# Patient Record
Sex: Male | Born: 1955 | Race: White | Hispanic: No | Marital: Married | Smoking: Never smoker
Health system: Southern US, Community
[De-identification: ages and names within clinical notes are randomized; demographics above are authoritative.]

## PROBLEM LIST (undated history)

## (undated) DIAGNOSIS — N529 Male erectile dysfunction, unspecified: Secondary | ICD-10-CM

## (undated) DIAGNOSIS — Z8619 Personal history of other infectious and parasitic diseases: Secondary | ICD-10-CM

## (undated) HISTORY — PX: OTHER SURGICAL HISTORY: SHX169

## (undated) HISTORY — PX: HERNIA REPAIR: SHX51

---

## 2014-02-12 ENCOUNTER — Encounter: Payer: Self-pay | Admitting: Emergency Medicine

## 2014-02-12 ENCOUNTER — Emergency Department (INDEPENDENT_AMBULATORY_CARE_PROVIDER_SITE_OTHER)
Admission: EM | Admit: 2014-02-12 | Discharge: 2014-02-12 | Disposition: A | Discharge status: Home / Self Care | Payer: BC Managed Care – PPO | Source: Home / Self Care | Attending: Family Medicine | Admitting: Family Medicine

## 2014-02-12 DIAGNOSIS — Z8619 Personal history of other infectious and parasitic diseases: Secondary | ICD-10-CM

## 2014-02-12 DIAGNOSIS — F528 Other sexual dysfunction not due to a substance or known physiological condition: Secondary | ICD-10-CM

## 2014-02-12 DIAGNOSIS — J309 Allergic rhinitis, unspecified: Secondary | ICD-10-CM

## 2014-02-12 DIAGNOSIS — F5221 Male erectile disorder: Secondary | ICD-10-CM

## 2014-02-12 HISTORY — DX: Male erectile dysfunction, unspecified: N52.9

## 2014-02-12 HISTORY — DX: Personal history of other infectious and parasitic diseases: Z86.19

## 2014-02-12 MED ORDER — ACYCLOVIR 400 MG PO TABS: ORAL_TABLET | ORAL | Status: DC

## 2014-02-12 MED ORDER — FLUTICASONE PROPIONATE 50 MCG/ACT NA SUSP: NASAL | Status: DC

## 2014-02-12 MED ORDER — SILDENAFIL CITRATE 100 MG PO TABS: 100.0000 mg | ORAL_TABLET | Freq: Every day | ORAL | Status: DC | PRN

## 2014-02-12 MED ORDER — AZITHROMYCIN 250 MG PO TABS: ORAL_TABLET | ORAL | Status: DC

## 2014-02-12 NOTE — ED Notes (Signed)
Reports 3-4 day history of congestion with burning in sinuses; and headache.

## 2014-02-12 NOTE — Discharge Instructions (Signed)
May use Afrin nasal spray (or generic oxymetazoline) once daily for about 5 days (use just prior to Flonase nasal spray).   Also recommend using saline nasal spray several times daily and saline nasal irrigation (AYR is a common brand) Begin Azithromycin if not improving about 1 week or if persistent fever develops

## 2014-02-12 NOTE — ED Provider Notes (Signed)
CSN: [id number]     Arrival date & time 02/12/14  1543 History   First MD Initiated Contact with Patient 02/12/14 1620     Chief Complaint  Patient presents with  . Nasal Congestion  . Headache     HPI Comments: Patient has a history of seasonal allergic rhinitis, and complains of four day history of increased sinus congestion and sneezing without URI symptoms or fever. He does not yet have a local PCP, and requests refill of Viagra for ED, and Acyclovir for recurrent cold sores.  He has had no adverse effects from these meds in the past.  The history is provided by the patient.    Past Medical History  Diagnosis Date  . H/O cold sores   . ED (erectile dysfunction)    Past Surgical History  Procedure Laterality Date  . Hernia repair     Family History  Problem Relation Age of Onset  . Heart failure Father    History  Substance Use Topics  . Smoking status: Never Smoker   . Smokeless tobacco: Not on file  . Alcohol Use: Yes    Review of Systems No sore throat No cough + sneezing No pleuritic pain No wheezing + nasal congestion + post-nasal drainage No sinus pain/pressure No itchy/red eyes No earache No hemoptysis No SOB No fever/chills No nausea No vomiting No abdominal pain No diarrhea No urinary symptoms No skin rash No fatigue No myalgias + headache Used OTC meds without relief  Allergies  Penicillins  Home Medications   Prior to Admission medications   Medication Sig Start Date End Date Taking? Authorizing Provider  acyclovir (ZOVIRAX) 400 MG tablet For recurrent cold sores, take one tab TID for 5 days. 02/12/14   Lattie HawStephen A Ashlynn Gunnels, MD  azithromycin (ZITHROMAX Z-PAK) 250 MG tablet Take 2 tabs today; then begin one tab once daily for 4 more days. (Rx void after 02/20/14) 02/12/14   Lattie HawStephen A Vin Yonke, MD  fluticasone [facility](FLONASE) 50 MCG/ACT nasal spray Place two sprays in each nostril once daily 02/12/14   Lattie HawStephen A Bryanda Mikel, MD  sildenafil (VIAGRA) 100 MG tablet  Take 1 tablet (100 mg total) by mouth daily as needed for erectile dysfunction. 02/12/14   Lattie HawStephen A Kayn Haymore, MD   BP 123/76  Pulse 68  Temp(Src) 98.6 F (37 C) (Oral)  Resp 16  Ht 5\' 11"  (1.803 m)  Wt 179 lb (81.194 kg)  BMI 24.98 kg/m2  SpO2 95% Physical Exam Nursing notes and Vital Signs reviewed. Appearance:  Patient appears healthy, stated age, and in no acute distress Eyes:  Pupils are equal, round, and reactive to light and accomodation.  Extraocular movement is intact.  Conjunctivae are not inflamed  Ears:  Canals normal.  Tympanic membranes normal.  Nose:  Congested turbinates.  No sinus tenderness.    Pharynx:  Normal Neck:  Supple.  Nontender enlarged posterior nodes palpated. Lungs:  Clear to auscultation.  Breath sounds are equal.  Heart:  Regular rate and rhythm without murmurs, rubs, or gallops.  Abdomen:  Nontender without masses or hepatosplenomegaly.  Bowel sounds are present.  No CVA or flank tenderness.  Extremities:  No edema.  No calf tenderness Skin:  No rash present.   ED Course  Procedures  mone      MDM   1. Allergic rhinitis, unspecified allergic rhinitis type   2. Erectile disorder, generalized, mild   3. H/O cold sores    Begin Flonase nasal spray.  May use Afrin nasal  spray (or generic oxymetazoline) once daily for about 5 days (use just prior to Flonase nasal spray).   Also recommend using saline nasal spray several times daily and saline nasal irrigation (AYR is a common brand) Begin Azithromycin if not improving about 1 week or if persistent fever develops (Given a prescription to hold, with an expiration date) Rifill Viagra and Acyclovir.  Followup with PCP for refills.    Lattie Haw, MD 02/18/14 [phone]

## 2014-09-13 ENCOUNTER — Ambulatory Visit (INDEPENDENT_AMBULATORY_CARE_PROVIDER_SITE_OTHER): Payer: BLUE CROSS/BLUE SHIELD | Admitting: Family Medicine

## 2014-09-13 ENCOUNTER — Encounter: Payer: Self-pay | Admitting: Family Medicine

## 2014-09-13 VITALS — BP 129/76 | HR 72 | Ht 71.5 in | Wt 187.0 lb

## 2014-09-13 DIAGNOSIS — N529 Male erectile dysfunction, unspecified: Secondary | ICD-10-CM | POA: Insufficient documentation

## 2014-09-13 DIAGNOSIS — F411 Generalized anxiety disorder: Secondary | ICD-10-CM | POA: Diagnosis not present

## 2014-09-13 MED ORDER — LORAZEPAM 0.5 MG PO TABS: 0.5000 mg | ORAL_TABLET | Freq: Every day | ORAL | Status: DC | PRN

## 2014-09-13 MED ORDER — SILDENAFIL CITRATE 20 MG PO TABS: ORAL_TABLET | ORAL | Status: DC

## 2014-09-13 NOTE — Progress Notes (Signed)
CC: Cameron DarterSteve Washington is a 59 y.o. male is here for Establish Care   Subjective: HPI:  Very pleasant 59 year old here to establish care, neighbor of the [place] family.  Patient reports a history of erectile dysfunction has been present for at least a year. Will happen with all sexual encounters he is monogamous with his wife. He denies any other recent or remote genitourinary complaints. He gets great relief from Viagra however is having to pay $40 per pill which limits how often he can be intimate with his wife. He denies any side effects while taking this medication. He gets benefit from 25-50 milligrams R2 sexual encounters. Denies exertional chest pain does not take nitrates.  Reports a history of situational anxiety that's been present for matter of years that only occurs 2 or 3 times a month. His former provider had provided him with Ativan for these occasions and he tells me that if he gets worked up over family issues with his son who lives in [place] he can take an Ativan and will be able to mentally calm down about the stress of whatever they talked about. He denies any sedation or coordination difficulty or memory loss from the medication. He denies any known side effects. He tells me that the only source of stress in his life is his son. He is happily married And happy with life overall. Denies any depression   Review of Systems - General ROS: negative for - chills, fever, night sweats, weight gain or weight loss Ophthalmic ROS: negative for - decreased vision Psychological ROS: negative for - depression ENT ROS: negative for - hearing change, nasal congestion, tinnitus or allergies Hematological and Lymphatic ROS: negative for - bleeding problems, bruising or swollen lymph nodes Breast ROS: negative Respiratory ROS: no cough, shortness of breath, or wheezing Cardiovascular ROS: no chest pain or dyspnea on exertion Gastrointestinal ROS: no abdominal pain, change in bowel habits, or black or  bloody stools Genito-Urinary ROS: negative for - genital discharge, genital ulcers, incontinence or abnormal bleeding from genitals Musculoskeletal ROS: negative for - joint pain or muscle pain Neurological ROS: negative for - headaches or memory loss Dermatological ROS: negative for lumps, mole changes, rash and skin lesion changes  History reviewed. No pertinent past medical history.  Past Surgical History  Procedure Laterality Date  . Double hernia surgery     Family History  Problem Relation Age of Onset  . Breast cancer Mother   . Heart attack Father   . Diabetes Father   . Hypertension Father     History   Social History  . Marital Status: Married    Spouse Name: N/A  . Number of Children: N/A  . Years of Education: N/A   Occupational History  . Not on file.   Social History Main Topics  . Smoking status: Never Smoker   . Smokeless tobacco: Not on file  . Alcohol Use: No  . Drug Use: No  . Sexual Activity:    Partners: Female   Other Topics Concern  . Not on file   Social History Narrative  . No narrative on file     Objective: BP 129/76 mmHg  Pulse 72  Ht 5' 11.5" (1.816 m)  Wt 187 lb (84.823 kg)  BMI 25.72 kg/m2  General: Alert and Oriented, No Acute Distress HEENT: Pupils equal, round, reactive to light. Conjunctivae clear. Moist mucous membranes  Lungs: Clear to auscultation bilaterally, no wheezing/ronchi/rales.  Comfortable work of breathing. Good air movement. Cardiac: Regular  rate and rhythm. Normal S1/S2.  No murmurs, rubs, nor gallops.   Extremities: No peripheral edema.  Strong peripheral pulses.  Mental Status: No depression, anxiety, nor agitation. Skin: Warm and dry.  Assessment & Plan: Cameron Washington was seen today for establish care.  Diagnoses and all orders for this visit:  Erectile dysfunction, unspecified erectile dysfunction type Orders: -     sildenafil (REVATIO) 20 MG tablet; One to four tabs by mouth as needed for sex.  Anxiety  state Orders: -     LORazepam (ATIVAN) 0.5 MG tablet; Take 1 tablet (0.5 mg total) by mouth daily as needed for anxiety.   Erectile dysfunction: Control, Discussed that he is more than welcome to get a prescription for Viagra however is worth trying a generic form of this that can be purchased at [facility]. I gave him the address of this pharmacy and also a prescription. Anxiety: Controlled on sparing use of lorazepam. Discussed that this is best to be used as infrequently as possible to develop a tolerance.   Return for As needed for complete physical..

## 2014-11-15 ENCOUNTER — Encounter: Payer: Self-pay | Admitting: Family Medicine

## 2014-11-15 ENCOUNTER — Ambulatory Visit (INDEPENDENT_AMBULATORY_CARE_PROVIDER_SITE_OTHER): Payer: BLUE CROSS/BLUE SHIELD | Admitting: Family Medicine

## 2014-11-15 VITALS — BP 131/78 | HR 68 | Wt 180.0 lb

## 2014-11-15 DIAGNOSIS — Z23 Encounter for immunization: Secondary | ICD-10-CM | POA: Diagnosis not present

## 2014-11-15 DIAGNOSIS — N200 Calculus of kidney: Secondary | ICD-10-CM | POA: Diagnosis not present

## 2014-11-15 DIAGNOSIS — L723 Sebaceous cyst: Secondary | ICD-10-CM | POA: Diagnosis not present

## 2014-11-15 DIAGNOSIS — Z0000 Encounter for general adult medical examination without abnormal findings: Secondary | ICD-10-CM

## 2014-11-15 LAB — CBC
HCT: 42.2 % (ref 39.0–52.0)
HEMOGLOBIN: 14.6 g/dL (ref 13.0–17.0)
MCH: 32.4 pg (ref 26.0–34.0)
MCHC: 34.6 g/dL (ref 30.0–36.0)
MCV: 93.8 fL (ref 78.0–100.0)
MPV: 10 fL (ref 8.6–12.4)
Platelets: 298 10*3/uL (ref 150–400)
RBC: 4.5 MIL/uL (ref 4.22–5.81)
RDW: 13.3 % (ref 11.5–15.5)
WBC: 7.8 10*3/uL (ref 4.0–10.5)

## 2014-11-15 LAB — LIPID PANEL
CHOL/HDL RATIO: 3.5 ratio
CHOLESTEROL: 174 mg/dL (ref 0–200)
HDL: 50 mg/dL (ref >=40)
LDL CALC: 108 mg/dL — AB (ref 0–99)
Triglycerides: 82 mg/dL (ref <150)
VLDL: 16 mg/dL (ref 0–40)

## 2014-11-15 LAB — COMPLETE METABOLIC PANEL WITH GFR
ALT: 20 U/L (ref 0–53)
AST: 15 U/L (ref 0–37)
Albumin: 4.1 g/dL (ref 3.5–5.2)
Alkaline Phosphatase: 54 U/L (ref 39–117)
BILIRUBIN TOTAL: 0.5 mg/dL (ref 0.2–1.2)
BUN: 21 mg/dL (ref 6–23)
CO2: 23 mEq/L (ref 19–32)
CREATININE: 1.07 mg/dL (ref 0.50–1.35)
Calcium: 9 mg/dL (ref 8.4–10.5)
Chloride: 104 mEq/L (ref 96–112)
GFR, EST AFRICAN AMERICAN: 87 mL/min
GFR, Est Non African American: 76 mL/min
Glucose, Bld: 106 mg/dL — ABNORMAL HIGH (ref 70–99)
Potassium: 4.4 mEq/L (ref 3.5–5.3)
Sodium: 139 mEq/L (ref 135–145)
Total Protein: 7.4 g/dL (ref 6.0–8.3)

## 2014-11-15 NOTE — Patient Instructions (Signed)
Dr. Chanson Teems's General Advice Following Your Complete Physical Exam  The Benefits of Regular Exercise: Unless you suffer from an uncontrolled cardiovascular condition, studies strongly suggest that regular exercise and physical activity will add to both the quality and length of your life.  The World Health Organization recommends 150 minutes of moderate intensity aerobic activity every week.  This is best split over 3-4 days a week, and can be as simple as a brisk walk for just over 35 minutes "most days of the week".  This type of exercise has been shown to lower LDL-Cholesterol, lower average blood sugars, lower blood pressure, lower cardiovascular disease risk, improve memory, and increase one's overall sense of wellbeing.  The addition of anaerobic (or "strength training") exercises offers additional benefits including but not limited to increased metabolism, prevention of osteoporosis, and improved overall cholesterol levels.  How Can I Strive For A Low-Fat Diet?: Current guidelines recommend that 25-35 percent of your daily energy (food) intake should come from fats.  One might ask how can this be achieved without having to dissect each meal on a daily basis?  Switch to skim or 1% milk instead of whole milk.  Focus on lean meats such as ground turkey, fresh fish, baked chicken, and lean cuts of beef as your source of dietary protein.  Limit saturated fat consumption to less than 10% of your daily caloric intake.  Limit trans fatty acid consumption primarily by limiting synthetic trans fats such as partially hydrogenated oils (Ex: fried fast foods).  Substitute olive or vegetable oil for solid fats where possible.  Moderation of Salt Intake: Provided you don't carry a diagnosis of congestive heart failure nor renal failure, I recommend a daily allowance of no more than 2300 mg of salt (sodium).  Keeping under this daily goal is associated with a decreased risk of cardiovascular events, creeping  above it can lead to elevated blood pressures and increases your risk of cardiovascular events.  Milligrams (mg) of salt is listed on all nutrition labels, and your daily intake can add up faster than you think.  Most canned and frozen dinners can pack in over half your daily salt allowance in one meal.    Lifestyle Health Risks: Certain lifestyle choices carry specific health risks.  As you may already know, tobacco use has been associated with increasing one's risk of cardiovascular disease, pulmonary disease, numerous cancers, among many other issues.  What you may not know is that there are medications and nicotine replacement strategies that can more than double your chances of successfully quitting.  I would be thrilled to help manage your quitting strategy if you currently use tobacco products.  When it comes to alcohol use, I've yet to find an "ideal" daily allowance.  Provided an individual does not have a medical condition that is exacerbated by alcohol consumption, general guidelines determine "safe drinking" as no more than two standard drinks for a man or no more than one standard drink for a male per day.  However, much debate still exists on whether any amount of alcohol consumption is technically "safe".  My general advice, keep alcohol consumption to a minimum for general health promotion.  If you or others believe that alcohol, tobacco, or recreational drug use is interfering with your life, I would be happy to provide confidential counseling regarding treatment options.  General "Over The Counter" Nutrition Advice: Postmenopausal women should aim for a daily calcium intake of 1200 mg, however a significant portion of this might already be   provided by diets including milk, yogurt, cheese, and other dairy products.  Vitamin D has been shown to help preserve bone density, prevent fatigue, and has even been shown to help reduce falls in the elderly.  Ensuring a daily intake of 800 Units of  Vitamin D is a good place to start to enjoy the above benefits, we can easily check your Vitamin D level to see if you'd potentially benefit from supplementation beyond 800 Units a day.  Folic Acid intake should be of particular concern to women of childbearing age.  Daily consumption of 400-800 mcg of Folic Acid is recommended to minimize the chance of spinal cord defects in a fetus should pregnancy occur.    For many adults, accidents still remain one of the most common culprits when it comes to cause of death.  Some of the simplest but most effective preventitive habits you can adopt include regular seatbelt use, proper helmet use, securing firearms, and regularly testing your smoke and carbon monoxide detectors.  Aubert Choyce B. Himani Corona DO [facility] [address], [address] [place], [place] [address] Phone: [phone]  

## 2014-11-15 NOTE — Addendum Note (Signed)
Addended by: Wyline BeadyMCCRIMMON, Burke Terry C on: 11/15/2014 09:15 AM   Modules accepted: Orders

## 2014-11-15 NOTE — Progress Notes (Signed)
CC: Cameron Washington is a 59 y.o. male is here for Annual Exam   Subjective: HPI:  Colonoscopy: Repeat 2020 Prostate: Discussed screening risks/beneifts with patient today, he's agreeable to PSA  Influenza Vaccine: UTD Pneumovax: no current indication Td/Tdap: Will receive Tdap today Zoster: (Start 59 yo)  Please note that patient is already established, he has a second chart with the wrong birthday that was used his first encounter. I've asked the front desk to merge this to his correct birthday which is this chart  No acute complaints  Review of Systems - General ROS: negative for - chills, fever, night sweats, weight gain or weight loss Ophthalmic ROS: negative for - decreased vision Psychological ROS: negative for - anxiety or depression ENT ROS: negative for - hearing change, nasal congestion, tinnitus or allergies Hematological and Lymphatic ROS: negative for - bleeding problems, bruising or swollen lymph nodes Breast ROS: negative Respiratory ROS: no cough, shortness of breath, or wheezing Cardiovascular ROS: no chest pain or dyspnea on exertion Gastrointestinal ROS: no abdominal pain, change in bowel habits, or black or bloody stools Genito-Urinary ROS: negative for - genital discharge, genital ulcers, incontinence or abnormal bleeding from genitals Musculoskeletal ROS: negative for - joint pain or muscle pain Neurological ROS: negative for - headaches or memory loss Dermatological ROS: negative for lumps, mole changes, rash and skin lesion changes  Past Medical History  Diagnosis Date  . H/O cold sores   . ED (erectile dysfunction)     Past Surgical History  Procedure Laterality Date  . Hernia repair     Family History  Problem Relation Age of Onset  . Heart failure Father     History   Social History  . Marital Status: Married    Spouse Name: N/A  . Number of Children: N/A  . Years of Education: N/A   Occupational History  . Not on file.   Social  History Main Topics  . Smoking status: Never Smoker   . Smokeless tobacco: Not on file  . Alcohol Use: Yes  . Drug Use: No  . Sexual Activity: Not on file   Other Topics Concern  . Not on file   Social History Narrative     Objective: BP 131/78 mmHg  Pulse 68  Wt 180 lb (81.647 kg)  General: No Acute Distress HEENT: Atraumatic, normocephalic, conjunctivae normal without scleral icterus.  No nasal discharge, hearing grossly intact, TMs with good landmarks bilaterally with no middle ear abnormalities, posterior pharynx clear without oral lesions. Neck: Supple, trachea midline, no cervical nor supraclavicular adenopathy. Pulmonary: Clear to auscultation bilaterally without wheezing, rhonchi, nor rales. Cardiac: Regular rate and rhythm.  No murmurs, rubs, nor gallops. No peripheral edema.  2+ peripheral pulses bilaterally. Abdomen: Bowel sounds normal.  No masses.  Non-tender without rebound.  Negative Murphy's sign. GU: Bilateral descended testes with no inguinal hernia MSK: Grossly intact, no signs of weakness.  Full strength throughout upper and lower extremities.  Full ROM in upper and lower extremities.  No midline spinal tenderness. Neuro: Gait unremarkable, CN II-XII grossly intact.  C5-C6 Reflex 2/4 Bilaterally, L4 Reflex 2/4 Bilaterally.  Cerebellar function intact. Skin: No rashes. Inflamed sebaceous cyst on the back midline thoracic region Psych: Alert and oriented to person/place/time.  Thought process normal. No anxiety/depression.  Assessment & Plan: Cameron Washington was seen today for annual exam.  Diagnoses and all orders for this visit:  Annual physical exam Orders: -     Lipid panel -  COMPLETE METABOLIC PANEL WITH GFR -     CBC -     PSA  Nephrolithiasis  Sebaceous cyst   Healthy lifestyle interventions including but not limited to regular exercise, a healthy low fat diet, moderation of salt intake, the dangers of tobacco/alcohol/recreational drug use, nutrition  supplementation, and accident avoidance were discussed with the patient and a handout was provided for future reference.  He updates me that he had a episode of nephrolithiasis while visiting [place] a month ago with no residual flank pain.  He's going to visit with a dermatologist this afternoon, I've asked him to let me know if they do not offer to remove the sebaceous cyst and I would be happy to do that in the future.   Return for Annual exams and on an as needed basis.

## 2014-11-16 ENCOUNTER — Encounter: Payer: Self-pay | Admitting: Family Medicine

## 2014-11-16 ENCOUNTER — Telehealth: Payer: Self-pay | Admitting: Family Medicine

## 2014-11-16 DIAGNOSIS — E785 Hyperlipidemia, unspecified: Secondary | ICD-10-CM | POA: Insufficient documentation

## 2014-11-16 DIAGNOSIS — R739 Hyperglycemia, unspecified: Secondary | ICD-10-CM

## 2014-11-16 LAB — PSA: PSA: 1.07 ng/mL (ref <=4.00)

## 2014-11-16 NOTE — Telephone Encounter (Signed)
Wife notified of results ok per release of info. ( pt has duplicate account)

## 2014-11-16 NOTE — Telephone Encounter (Signed)
Sue Lushndrea, Will you please let patient know that his LDL cholesterol was mildly elevated however not to a degree that warrants cholesterol lowering medication. This can be improved with engaging in 30-45 minutes of moderate exercise most days of the week and increasing fiber in the diet.  The psa prostate test was normal as was his  Kidney function, liver function and blood cell counts.  His fasting blood sugar was mildly elevated and I would recommend having a three month average blood sugar checked, can you please ask the lab to add this on and if this isn't possible a lab slip is in your inbox.

## 2014-11-28 LAB — HEMOGLOBIN A1C
HEMOGLOBIN A1C: 5.8 % — AB (ref <5.7)
MEAN PLASMA GLUCOSE: 120 mg/dL — AB (ref <117)

## 2015-06-05 ENCOUNTER — Ambulatory Visit (INDEPENDENT_AMBULATORY_CARE_PROVIDER_SITE_OTHER): Payer: BLUE CROSS/BLUE SHIELD | Admitting: Family Medicine

## 2015-06-05 VITALS — BP 134/95 | HR 70 | Temp 97.9°F | Wt 186.0 lb

## 2015-06-05 DIAGNOSIS — Z23 Encounter for immunization: Secondary | ICD-10-CM | POA: Diagnosis not present

## 2015-06-05 NOTE — Progress Notes (Signed)
Patient came into clinic today for flu vaccination. Pt has a new grandchild due soon and wanted to make sure he was up to date on dtap too. Pt is current on that vaccination. Pt tolerated flu shot well in right deltoid with no complications. Advised to contact our office with any questions/concerns.

## 2015-07-31 ENCOUNTER — Other Ambulatory Visit: Payer: Self-pay | Admitting: Family Medicine

## 2015-11-21 ENCOUNTER — Encounter: Payer: Self-pay | Admitting: Family Medicine

## 2015-11-21 ENCOUNTER — Ambulatory Visit (INDEPENDENT_AMBULATORY_CARE_PROVIDER_SITE_OTHER): Payer: BLUE CROSS/BLUE SHIELD | Admitting: Family Medicine

## 2015-11-21 VITALS — BP 132/70 | HR 63 | Wt 179.0 lb

## 2015-11-21 DIAGNOSIS — Z0000 Encounter for general adult medical examination without abnormal findings: Secondary | ICD-10-CM

## 2015-11-21 LAB — COMPLETE METABOLIC PANEL WITH GFR
ALBUMIN: 4.3 g/dL (ref 3.6–5.1)
ALT: 13 U/L (ref 9–46)
AST: 17 U/L (ref 10–35)
Alkaline Phosphatase: 53 U/L (ref 40–115)
BUN: 25 mg/dL (ref 7–25)
CALCIUM: 9.3 mg/dL (ref 8.6–10.3)
CHLORIDE: 101 mmol/L (ref 98–110)
CO2: 28 mmol/L (ref 20–31)
Creat: 1.18 mg/dL (ref 0.70–1.25)
GFR, EST AFRICAN AMERICAN: 77 mL/min (ref >=60)
GFR, EST NON AFRICAN AMERICAN: 67 mL/min (ref >=60)
Glucose, Bld: 103 mg/dL — ABNORMAL HIGH (ref 65–99)
POTASSIUM: 4.4 mmol/L (ref 3.5–5.3)
Sodium: 137 mmol/L (ref 135–146)
Total Bilirubin: 0.6 mg/dL (ref 0.2–1.2)
Total Protein: 7.1 g/dL (ref 6.1–8.1)

## 2015-11-21 LAB — CBC
HCT: 46.3 % (ref 38.5–50.0)
Hemoglobin: 15.7 g/dL (ref 13.2–17.1)
MCH: 32.6 pg (ref 27.0–33.0)
MCHC: 33.9 g/dL (ref 32.0–36.0)
MCV: 96.1 fL (ref 80.0–100.0)
MPV: 10.4 fL (ref 7.5–12.5)
Platelets: 258 10*3/uL (ref 140–400)
RBC: 4.82 MIL/uL (ref 4.20–5.80)
RDW: 13.3 % (ref 11.0–15.0)
WBC: 5.6 10*3/uL (ref 3.8–10.8)

## 2015-11-21 LAB — LIPID PANEL
CHOL/HDL RATIO: 3.4 ratio (ref <=5.0)
Cholesterol: 191 mg/dL (ref 125–200)
HDL: 57 mg/dL (ref >=40)
LDL Cholesterol: 119 mg/dL (ref <130)
Triglycerides: 74 mg/dL (ref <150)
VLDL: 15 mg/dL (ref <30)

## 2015-11-21 MED ORDER — ACYCLOVIR 400 MG PO TABS: ORAL_TABLET | ORAL | Status: DC

## 2015-11-21 MED ORDER — ZOSTER VACCINE LIVE 19400 UNT/0.65ML ~~LOC~~ SUSR: 0.6500 mL | Freq: Once | SUBCUTANEOUS | Status: DC

## 2015-11-21 NOTE — Patient Instructions (Signed)
Dr. Grey Rakestraw's General Advice Following Your Complete Physical Exam  The Benefits of Regular Exercise: Unless you suffer from an uncontrolled cardiovascular condition, studies strongly suggest that regular exercise and physical activity will add to both the quality and length of your life.  The World Health Organization recommends 150 minutes of moderate intensity aerobic activity every week.  This is best split over 3-4 days a week, and can be as simple as a brisk walk for just over 35 minutes "most days of the week".  This type of exercise has been shown to lower LDL-Cholesterol, lower average blood sugars, lower blood pressure, lower cardiovascular disease risk, improve memory, and increase one's overall sense of wellbeing.  The addition of anaerobic (or "strength training") exercises offers additional benefits including but not limited to increased metabolism, prevention of osteoporosis, and improved overall cholesterol levels.  How Can I Strive For A Low-Fat Diet?: Current guidelines recommend that 25-35 percent of your daily energy (food) intake should come from fats.  One might ask how can this be achieved without having to dissect each meal on a daily basis?  Switch to skim or 1% milk instead of whole milk.  Focus on lean meats such as ground turkey, fresh fish, baked chicken, and lean cuts of beef as your source of dietary protein.  Limit saturated fat consumption to less than 10% of your daily caloric intake.  Limit trans fatty acid consumption primarily by limiting synthetic trans fats such as partially hydrogenated oils (Ex: fried fast foods).  Substitute olive or vegetable oil for solid fats where possible.  Moderation of Salt Intake: Provided you don't carry a diagnosis of congestive heart failure nor renal failure, I recommend a daily allowance of no more than 2300 mg of salt (sodium).  Keeping under this daily goal is associated with a decreased risk of cardiovascular events, creeping  above it can lead to elevated blood pressures and increases your risk of cardiovascular events.  Milligrams (mg) of salt is listed on all nutrition labels, and your daily intake can add up faster than you think.  Most canned and frozen dinners can pack in over half your daily salt allowance in one meal.    Lifestyle Health Risks: Certain lifestyle choices carry specific health risks.  As you may already know, tobacco use has been associated with increasing one's risk of cardiovascular disease, pulmonary disease, numerous cancers, among many other issues.  What you may not know is that there are medications and nicotine replacement strategies that can more than double your chances of successfully quitting.  I would be thrilled to help manage your quitting strategy if you currently use tobacco products.  When it comes to alcohol use, I've yet to find an "ideal" daily allowance.  Provided an individual does not have a medical condition that is exacerbated by alcohol consumption, general guidelines determine "safe drinking" as no more than two standard drinks for a man or no more than one standard drink for a male per day.  However, much debate still exists on whether any amount of alcohol consumption is technically "safe".  My general advice, keep alcohol consumption to a minimum for general health promotion.  If you or others believe that alcohol, tobacco, or recreational drug use is interfering with your life, I would be happy to provide confidential counseling regarding treatment options.  General "Over The Counter" Nutrition Advice: Postmenopausal women should aim for a daily calcium intake of 1200 mg, however a significant portion of this might already be   provided by diets including milk, yogurt, cheese, and other dairy products.  Vitamin D has been shown to help preserve bone density, prevent fatigue, and has even been shown to help reduce falls in the elderly.  Ensuring a daily intake of 800 Units of  Vitamin D is a good place to start to enjoy the above benefits, we can easily check your Vitamin D level to see if you'd potentially benefit from supplementation beyond 800 Units a day.  Folic Acid intake should be of particular concern to women of childbearing age.  Daily consumption of 400-800 mcg of Folic Acid is recommended to minimize the chance of spinal cord defects in a fetus should pregnancy occur.    For many adults, accidents still remain one of the most common culprits when it comes to cause of death.  Some of the simplest but most effective preventitive habits you can adopt include regular seatbelt use, proper helmet use, securing firearms, and regularly testing your smoke and carbon monoxide detectors.  Annie Saephan B. Wendel Homeyer DO [facility] [address], [address] [place], [place] [address] Phone: [phone]  

## 2015-11-21 NOTE — Progress Notes (Signed)
CC: Cameron Washington is a 60 y.o. male is here for Annual Exam   Subjective: HPI:  Colonoscopy: Repeat 2020 Prostate: Discussed screening risks/beneifts with patient today, he is open to the idea of getting a PSA  Influenza Vaccine: No current indication Pneumovax: No current indication Td/Tdap: UTD Zoster: He was given a written prescription for this today and encouraged to see if affordable at his pharmacy, strongly encouraged to get this immunization if affordable.  Requesting complete physical exam with no acute complaints  Review of Systems - General ROS: negative for - chills, fever, night sweats, weight gain or weight loss Ophthalmic ROS: negative for - decreased vision Psychological ROS: negative for - anxiety or depression ENT ROS: negative for - hearing change, nasal congestion, tinnitus or allergies Hematological and Lymphatic ROS: negative for - bleeding problems, bruising or swollen lymph nodes Breast ROS: negative Respiratory ROS: no cough, shortness of breath, or wheezing Cardiovascular ROS: no chest pain or dyspnea on exertion Gastrointestinal ROS: no abdominal pain, change in bowel habits, or black or bloody stools Genito-Urinary ROS: negative for - genital discharge, genital ulcers, incontinence or abnormal bleeding from genitals Musculoskeletal ROS: negative for - joint pain or muscle pain Neurological ROS: negative for - headaches or memory loss Dermatological ROS: negative for lumps, mole changes, rash and skin lesion changes  Past Medical History  Diagnosis Date  . H/O cold sores   . ED (erectile dysfunction)     Past Surgical History  Procedure Laterality Date  . Hernia repair     Family History  Problem Relation Age of Onset  . Heart failure Father     Social History   Social History  . Marital Status: Married    Spouse Name: N/A  . Number of Children: N/A  . Years of Education: N/A   Occupational History  . Not on file.   Social History  Main Topics  . Smoking status: Never Smoker   . Smokeless tobacco: Not on file  . Alcohol Use: Yes  . Drug Use: No  . Sexual Activity: Not on file   Other Topics Concern  . Not on file   Social History Narrative     Objective: BP 132/70 mmHg  Pulse 63  Wt 179 lb (81.194 kg)  General: No Acute Distress HEENT: Atraumatic, normocephalic, conjunctivae normal without scleral icterus.  No nasal discharge, hearing grossly intact, TMs with good landmarks bilaterally with no middle ear abnormalities, posterior pharynx clear without oral lesions. Neck: Supple, trachea midline, no cervical nor supraclavicular adenopathy. Pulmonary: Clear to auscultation bilaterally without wheezing, rhonchi, nor rales. Cardiac: Regular rate and rhythm.  No murmurs, rubs, nor gallops. No peripheral edema.  2+ peripheral pulses bilaterally. Abdomen: Bowel sounds normal.  No masses.  Non-tender without rebound.  Negative Murphy's sign. MSK: Grossly intact, no signs of weakness.  Full strength throughout upper and lower extremities.  Full ROM in upper and lower extremities.  No midline spinal tenderness. Neuro: Gait unremarkable, CN II-XII grossly intact.  C5-C6 Reflex 2/4 Bilaterally, L4 Reflex 2/4 Bilaterally.  Cerebellar function intact. Skin: No rashes. Psych: Alert and oriented to person/place/time.  Thought process normal. No anxiety/depression. Assessment & Plan: Cameron Washington was seen today for annual exam.  Diagnoses and all orders for this visit:  Annual physical exam -     acyclovir (ZOVIRAX) 400 MG tablet; For recurrent cold sores, take one tab TID for 5 days. -     Lipid panel -     CBC -  COMPLETE METABOLIC PANEL WITH GFR -     PSA  Other orders -     Zoster Vaccine Live, PF, (ZOSTAVAX) [id number] UNT/0.65ML injection; Inject 19,400 Units into the skin once.  Healthy lifestyle interventions including but not limited to regular exercise, a healthy low fat diet, moderation of salt intake, the dangers  of tobacco/alcohol/recreational drug use, nutrition supplementation, and accident avoidance were discussed with the patient and a handout was provided for future reference. He would like refills on acyclovir, he takes this sparingly for cold sores. If he gets the shingles vaccine and encouraged him to avoid taking acyclovir for at least a month.  Return in about 1 year (around 11/20/2016) for annual physical.

## 2015-11-22 LAB — PSA: PSA: 0.72 ng/mL (ref <=4.00)

## 2015-12-17 ENCOUNTER — Ambulatory Visit (INDEPENDENT_AMBULATORY_CARE_PROVIDER_SITE_OTHER): Payer: BLUE CROSS/BLUE SHIELD | Admitting: Family Medicine

## 2015-12-17 ENCOUNTER — Encounter: Payer: Self-pay | Admitting: Family Medicine

## 2015-12-17 ENCOUNTER — Ambulatory Visit (INDEPENDENT_AMBULATORY_CARE_PROVIDER_SITE_OTHER): Payer: BLUE CROSS/BLUE SHIELD | Admitting: Sports Medicine

## 2015-12-17 VITALS — BP 128/75 | HR 61 | Wt 182.0 lb

## 2015-12-17 DIAGNOSIS — M25532 Pain in left wrist: Secondary | ICD-10-CM | POA: Diagnosis not present

## 2015-12-17 NOTE — Progress Notes (Signed)
CC: Cameron DarterSteve Washington is a 60 y.o. male is here for Wrist Pain   Subjective: HPI:  Left wrist pain off and on for matter of years now. Over the past month has been bothering him on a daily basis and is now getting in the way of quality of life. He localizes it at the wrist just proximal to the base of the thumb. He denies any radiation but also has some pain on the dorsal aspect of the wrist as well. He's had off and on swelling but never any redness. No benefit from over-the-counter nonsteroidal anti-inflammatories. About once a year he gets an injection in the wrist and wants to know if we can help him with this. Denies any joint pain elsewhere. No fevers no chills or skin changes.   Review Of Systems Outlined In HPI  Past Medical History  Diagnosis Date  . H/O cold sores   . ED (erectile dysfunction)     Past Surgical History  Procedure Laterality Date  . Double hernia surgery    . Hernia repair     Family History  Problem Relation Age of Onset  . Breast cancer Mother   . Heart attack Father   . Diabetes Father   . Hypertension Father   . Heart failure Father     Social History   Social History  . Marital Status: Married    Spouse Name: N/A  . Number of Children: N/A  . Years of Education: N/A   Occupational History  . Not on file.   Social History Main Topics  . Smoking status: Never Smoker   . Smokeless tobacco: Not on file  . Alcohol Use: 0.0 oz/week    0 Standard drinks or equivalent per week  . Drug Use: No  . Sexual Activity:    Partners: Female   Other Topics Concern  . Not on file   Social History Narrative   ** Merged History Encounter **         Objective: BP 128/75 mmHg  Pulse 61  Wt 182 lb (82.555 kg)  Vital signs reviewed. General: Alert and Oriented, No Acute Distress HEENT: Pupils equal, round, reactive to light. Conjunctivae clear.  External ears unremarkable.  Moist mucous membranes. Lungs: Clear and comfortable work of breathing,  speaking in full sentences without accessory muscle use. Cardiac: Regular rate and rhythm.  Neuro: CN II-XII grossly intact, gait normal. Extremities:  Strong peripheral pulses. Mild soft tissue swelling in the dorsal aspect of the left wrist, positive Finkelstein's, no pain anatomic snuffbox, no weakness of the thumb. Mental Status: No depression, anxiety, nor agitation. Logical though process. Skin: Warm and dry.  Assessment & Plan: Brett CanalesSteve was seen today for wrist pain.  Diagnoses and all orders for this visit:  Left wrist pain   Referred him to Dr. Karie Schwalbe in sports medicine for consideration of triamcinolone injection, follow up with me in one week if not beneficial.  Return if symptoms worsen or fail to improve.

## 2015-12-17 NOTE — Progress Notes (Signed)
Subjective:    I'm seeing this patient as a consultation for:  Dr. Laren Boom  CC: Left wrist pain  HPI: This is a pleasant 60 year old male, he comes in with a long history of left wrist pain that responded well to radiocarpal joint injections at an outside office, he desires repeat interventional treatment today, pain is localized over the dorsum of the radiocarpal joint as well as over the first extensor compartment. Moderate, persistent with radiation into the hand and proximally into the forearm. No trauma.  Past medical history, Surgical history, Family history not pertinant except as noted below, Social history, Allergies, and medications have been entered into the medical record, reviewed, and no changes needed.   Review of Systems: No headache, visual changes, nausea, vomiting, diarrhea, constipation, dizziness, abdominal pain, skin rash, fevers, chills, night sweats, weight loss, swollen lymph nodes, body aches, joint swelling, muscle aches, chest pain, shortness of breath, mood changes, visual or auditory hallucinations.   Objective:   General: Well Developed, well nourished, and in no acute distress.  Neuro/Psych: Alert and oriented x3, extra-ocular muscles intact, able to move all 4 extremities, sensation grossly intact. Skin: Warm and dry, no rashes noted.  Respiratory: Not using accessory muscles, speaking in full sentences, trachea midline.  Cardiovascular: Pulses palpable, no extremity edema. Abdomen: Does not appear distended. Left Wrist: Inspection normal with no visible erythema or swelling. ROM smooth and normal with good flexion and extension and ulnar/radial deviation that is symmetrical with opposite wrist. Tender to palpation over the radiocarpal joint, first extensor compartment. No snuffbox tenderness. No tenderness over Canal of [place]. Strength 5/5 in all directions without pain. Positive Finkelstein sign Negative Watson's test.  Procedure: Real-time  Ultrasound Guided Injection of left first extensor compartment Device: GE Logiq E  Verbal informed consent obtained.  Time-out conducted.  Noted no overlying erythema, induration, or other signs of local infection.  Skin prepped in a sterile fashion.  Local anesthesia: Topical Ethyl chloride.  With sterile technique and under real time ultrasound guidance:  1 mL lidocaine, 1 mL Marcaine, 1 mL kenalog 40 injected easily Completed without difficulty  Pain immediately resolved suggesting accurate placement of the medication.  Advised to call if fevers/chills, erythema, induration, drainage, or persistent bleeding.  Images permanently stored and available for review in the ultrasound unit.  Impression: Technically successful ultrasound guided injection.  Procedure: Real-time Ultrasound Guided Injection of left fourth extensor compartment Device: GE Logiq E  Verbal informed consent obtained.  Time-out conducted.  Noted no overlying erythema, induration, or other signs of local infection.  Skin prepped in a sterile fashion.  Local anesthesia: Topical Ethyl chloride.  With sterile technique and under real time ultrasound guidance:  While approaching the radiocarpal joint and noted tendon sheath effusion in the fourth extensor compartment, needle was redirected into the fourth extensor compartment and 1/2 mL lidocaine, 1/2 mL Marcaine, 1/2 mL kenalog 40 injected easily. Completed without difficulty  Pain immediately resolved suggesting accurate placement of the medication.  Advised to call if fevers/chills, erythema, induration, drainage, or persistent bleeding.  Images permanently stored and available for review in the ultrasound unit.  Impression: Technically successful ultrasound guided injection.  Procedure: Real-time Ultrasound Guided Injection of left radiocarpal joint Device: GE Logiq E  Verbal informed consent obtained.  Time-out conducted.  Noted no overlying erythema, induration, or  other signs of local infection.  Skin prepped in a sterile fashion.  Local anesthesia: Topical Ethyl chloride.  With sterile technique and under real time  ultrasound guidance:  After the fourth extensor compartment injection I then redirected the needle into the radiocarpal joint the extensor digitorum communis, I then injected the rest of the medication, which was 1/2 mL lidocaine, 1/2 mL Marcaine, 1/2 mL kenalog 40 Completed without difficulty  Pain immediately resolved suggesting accurate placement of the medication.  Advised to call if fevers/chills, erythema, induration, drainage, or persistent bleeding.  Images permanently stored and available for review in the ultrasound unit.  Impression: Technically successful ultrasound guided injection.  The wrist was then strapped with compressive dressing.  Impression and Recommendations:   This case required medical decision making of moderate complexity.

## 2015-12-17 NOTE — Assessment & Plan Note (Addendum)
Pain is multifactorial including de Quervain's tendinitis, fourth extensor compartment tendinitis, as well as radiocarpal osteoarthritis, injections placed into these 3 structures, return to see me in one month.

## 2016-02-26 ENCOUNTER — Ambulatory Visit (INDEPENDENT_AMBULATORY_CARE_PROVIDER_SITE_OTHER): Payer: BLUE CROSS/BLUE SHIELD | Admitting: Family Medicine

## 2016-02-26 ENCOUNTER — Encounter: Payer: Self-pay | Admitting: Family Medicine

## 2016-02-26 DIAGNOSIS — Z0000 Encounter for general adult medical examination without abnormal findings: Secondary | ICD-10-CM | POA: Diagnosis not present

## 2016-02-26 DIAGNOSIS — M7041 Prepatellar bursitis, right knee: Secondary | ICD-10-CM | POA: Insufficient documentation

## 2016-02-26 DIAGNOSIS — M25532 Pain in left wrist: Secondary | ICD-10-CM

## 2016-02-26 DIAGNOSIS — F411 Generalized anxiety disorder: Secondary | ICD-10-CM | POA: Diagnosis not present

## 2016-02-26 DIAGNOSIS — N529 Male erectile dysfunction, unspecified: Secondary | ICD-10-CM

## 2016-02-26 MED ORDER — SILDENAFIL CITRATE 20 MG PO TABS: ORAL_TABLET | ORAL | 0 refills | Status: DC

## 2016-02-26 MED ORDER — ACYCLOVIR 400 MG PO TABS: ORAL_TABLET | ORAL | 1 refills | Status: DC

## 2016-02-26 MED ORDER — LORAZEPAM 0.5 MG PO TABS: 0.5000 mg | ORAL_TABLET | Freq: Every day | ORAL | 0 refills | Status: DC | PRN

## 2016-02-26 NOTE — Progress Notes (Signed)
   Subjective:    I'm seeing this patient as a consultation for:  Dr. 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  Past medical history, Surgical history, Family history not pertinant except as noted below, Social history, Allergies, and medications have been entered into the medical record, reviewed, and no changes needed.   Review of Systems: No headache, visual changes, nausea, vomiting, diarrhea, constipation, dizziness, abdominal pain, skin rash, fevers, chills, night sweats, weight loss, swollen lymph nodes, body aches, joint swelling, muscle aches, chest pain, shortness of breath, mood changes, visual or auditory hallucinations.   Objective:   General: Well Developed, well nourished, and in no acute distress.  Neuro/Psych: Alert and oriented x3, extra-ocular muscles intact, able to move all 4 extremities, sensation grossly intact. Skin: Warm and dry, no rashes noted.  Respiratory: Not using accessory muscles, speaking in full sentences, trachea midline.  Cardiovascular: Pulses palpable, no extremity edema. Abdomen: Does not appear distended. Right Knee: Normal to inspection with no erythema or effusion or obvious bony abnormalities. Visible and palpable distended prepatellar bursa without overlying erythema or induration ROM normal in flexion and extension and lower leg rotation. Ligaments with solid consistent endpoints including ACL, PCL, LCL, MCL. Negative Mcmurray's and provocative meniscal tests. Non painful patellar compression. Patellar and quadriceps tendons unremarkable. Hamstring and quadriceps strength is normal.  Procedure: Real-time Ultrasound Guided aspiration/Injection of right  prepatellar bursa Device: GE Logiq E  Verbal informed consent obtained.  Time-out conducted.  Noted no overlying erythema, induration, or other signs of local infection.  Skin prepped in a sterile fashion.  Local anesthesia: Topical Ethyl chloride.  With sterile technique and under real time ultrasound guidance:  Aspirated 10 mL serosanguineous fluid, syringe switched and 1 mL Kenalog recall 1 mL lidocaine injected easily. Completed without difficulty  Pain immediately resolved suggesting accurate placement of the medication.  Advised to call if fevers/chills, erythema, induration, drainage, or persistent bleeding.  Images permanently stored and available for review in the ultrasound unit.  Impression: Technically successful ultrasound guided injection.  The knee was then strapped with compressive dressing.  Impression and Recommendations:   This case required medical decision making of moderate complexity.  Prepatellar bursitis of right knee Aspiration with crystal analysis, injection. Return in one month. Strap with compressive dressing.  Left wrist pain Was multifactorial with de Quervain's tendinitis, fourth extensor tendinitis as well as radiocarpal arthritis, never followed up at this resolved completely after injections into each of the 3 structures  several months ago.

## 2016-02-26 NOTE — Assessment & Plan Note (Signed)
Aspiration with crystal analysis, injection. Return in one month. Strap with compressive dressing.

## 2016-02-26 NOTE — Assessment & Plan Note (Signed)
Was multifactorial with de Quervain's tendinitis, fourth extensor tendinitis as well as radiocarpal arthritis, never followed up at this resolved completely after injections into each of the 3 structures  several months ago.

## 2016-02-26 NOTE — Progress Notes (Signed)
CC: Cameron Washington is a 60 y.o. male is here for Joint Swelling   Subjective: HPI:  For the past 3-4 weeks he's had swelling on the right knee. It's anterior to the knee. It's never been painful. 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 He denies any locking catching giving way of the knee. He denies any skin changes elsewhere. No interventions as of yet. Denies any fevers, chills or joint pain.   Review Of Systems Outlined In HPI  Past Medical History:  Diagnosis Date  . ED (erectile dysfunction)   . H/O cold sores     Past Surgical History:  Procedure Laterality Date  . double hernia surgery    . HERNIA REPAIR     Family History  Problem Relation Age of Onset  . Breast cancer Mother   . Heart attack Father   . Diabetes Father   . Hypertension Father   . Heart failure Father     Social History   Social History  . Marital status: Married    Spouse name: N/A  . Number of children: N/A  . Years of education: N/A   Occupational History  . Not on file.   Social History Main Topics  . Smoking status: Never Smoker  . Smokeless tobacco: Not on file  . Alcohol use 0.0 oz/week  . Drug use: No  . Sexual activity: Yes    Partners: Female   Other Topics Concern  . Not on file   Social History Narrative   ** Merged History Encounter **         Objective: BP 112/78   Pulse 66   Wt 182 lb (82.6 kg)   BMI 25.03 kg/m   Vital signs reviewed. General: Alert and Oriented, No Acute Distress HEENT: Pupils equal, round, reactive to light. Conjunctivae clear.  External ears unremarkable.  Moist mucous membranes. Lungs: Clear and comfortable work of breathing, speaking in full sentences without accessory muscle use. Cardiac: Regular rate and rhythm.  Neuro: CN II-XII grossly intact, gait normal. Extremities: No peripheral edema.  Strong peripheral pulses. Just below and anterior to the right knee There is a nontender spongy  mass. Mental Status: No depression, anxiety, nor agitation. Logical though process. Skin: Warm and dry.  Assessment & Plan: Cameron CanalesSteve was seen today for joint swelling.  Diagnoses and all orders for this visit:  Anxiety state -     LORazepam (ATIVAN) 0.5 MG tablet; Take 1 tablet (0.5 mg total) by mouth daily as needed for anxiety.  Annual physical exam -     acyclovir (ZOVIRAX) 400 MG tablet; For recurrent cold sores, take one tab TID for 5 days.  Erectile dysfunction, unspecified erectile dysfunction type -     sildenafil (REVATIO) 20 MG tablet; One to four tabs by mouth as needed for sex.  Prepatellar bursitis of right knee -     Synovial cell count + diff, w/ crystals  Left wrist pain   I consulted Dr. Karie Schwalbe in sports medicine to drain his prepatellar bursitis. He was successfully draining the bursa and the patient was satisfied. Patient would also like refills on the above medications after knowing that I will be leaving in September. Reestablish his with a new PCP he would like to meet the new male physician possibly joining our practice in the coming months.  Return in about 3 months (around 05/28/2016).

## 2016-02-27 LAB — SYNOVIAL CELL COUNT + DIFF, W/ CRYSTALS
Basophils, %: 0 %
Eosinophils-Synovial: 0 % (ref 0–2)
Lymphocytes-Synovial Fld: 24 % (ref 0–74)
Monocyte/Macrophage: 17 % (ref 0–69)
Neutrophil, Synovial: 57 % — ABNORMAL HIGH (ref 0–24)
Synoviocytes, %: 2 % (ref 0–15)
WBC, Synovial: 1275 cells/uL — ABNORMAL HIGH (ref <150)

## 2016-03-27 DIAGNOSIS — B09 Unspecified viral infection characterized by skin and mucous membrane lesions: Secondary | ICD-10-CM | POA: Diagnosis not present

## 2016-04-07 ENCOUNTER — Ambulatory Visit (INDEPENDENT_AMBULATORY_CARE_PROVIDER_SITE_OTHER): Payer: BLUE CROSS/BLUE SHIELD | Admitting: Sports Medicine

## 2016-04-07 DIAGNOSIS — M7041 Prepatellar bursitis, right knee: Secondary | ICD-10-CM | POA: Diagnosis not present

## 2016-04-07 NOTE — Assessment & Plan Note (Signed)
Second aspiration and injection. We will attempt this one more time if needed before sending off for surgical excision of the bursa.

## 2016-04-07 NOTE — Progress Notes (Signed)
  Procedure: Real-time Ultrasound Guided aspiration/injection of right prepatellar bursa Device: GE Logiq E  Verbal informed consent obtained.  Time-out conducted.  Noted no overlying erythema, induration, or other signs of local infection.  Skin prepped in a sterile fashion.  Local anesthesia: Topical Ethyl chloride.  With sterile technique and under real time ultrasound guidance:  Aspirated 9 mL of sanguinous fluid, syringe switched and 1 mL Kenalog  40, 2 mL lidocaine, 2 mL Marcaine injected easily Completed without difficulty  Pain immediately resolved suggesting accurate placement of the medication.  Advised to call if fevers/chills, erythema, induration, drainage, or persistent bleeding.  Images permanently stored and available for review in the ultrasound unit.  Impression: Technically successful ultrasound guided injection.

## 2016-05-25 ENCOUNTER — Ambulatory Visit: Payer: Self-pay | Admitting: Family Medicine

## 2016-05-27 ENCOUNTER — Encounter: Payer: Self-pay | Admitting: *Deleted

## 2016-05-27 ENCOUNTER — Emergency Department
Admission: EM | Admit: 2016-05-27 | Discharge: 2016-05-27 | Disposition: A | Discharge status: Home / Self Care | Payer: BLUE CROSS/BLUE SHIELD | Source: Home / Self Care | Attending: Family Medicine | Admitting: Family Medicine

## 2016-05-27 DIAGNOSIS — L03114 Cellulitis of left upper limb: Secondary | ICD-10-CM

## 2016-05-27 MED ORDER — CEPHALEXIN 500 MG PO CAPS: 500.0000 mg | ORAL_CAPSULE | Freq: Two times a day (BID) | ORAL | 0 refills | Status: DC

## 2016-05-27 NOTE — ED Provider Notes (Signed)
CSN: [id number]     Arrival date & time 05/27/16  0940 History   First MD Initiated Contact with Patient 05/27/16 1015     Chief Complaint  Patient presents with  . Arm Swelling   (Consider location/radiation/quality/duration/timing/severity/associated sxs/prior Treatment) HPI Cameron Washington is a 60 y.o. male presenting to UC with c/o redness and swelling to his Left forearm and Left ring finger that started about 2 nights ago. Pt does not recall any specific insect bites but notes he is outside often.  He also notes a family member's dog jumped on him the other night causing a small scratch near the area of current redness and swelling but notes there is a spot that looks like an insect bite in the middle of the redness.  He has used OTC polysporin w/o relief. He notes he also had to remove his wedding band with soap and water the other night due to swelling to his finger.  Denies fever, chills, n/v/d.    Past Medical History:  Diagnosis Date  . ED (erectile dysfunction)   . H/O cold sores    Past Surgical History:  Procedure Laterality Date  . double hernia surgery    . HERNIA REPAIR     Family History  Problem Relation Age of Onset  . Breast cancer Mother   . Heart attack Father   . Diabetes Father   . Hypertension Father   . Heart failure Father    Social History  Substance Use Topics  . Smoking status: Never Smoker  . Smokeless tobacco: Never Used  . Alcohol use 0.0 oz/week    Review of Systems  Constitutional: Negative for chills and fever.  Gastrointestinal: Negative for diarrhea, nausea and vomiting.  Musculoskeletal: Positive for arthralgias, joint swelling and myalgias.       Left forearm and ring finger  Skin: Positive for color change, rash and wound.  Neurological: Negative for weakness and numbness.    Allergies  Penicillins  Home Medications   Prior to Admission medications   Medication Sig Start Date End Date Taking? Authorizing Provider  LORazepam  (ATIVAN) 0.5 MG tablet Take 1 tablet (0.5 mg total) by mouth daily as needed for anxiety. 02/26/16  Yes Monica Bectonhomas J Thekkekandam, MD  acyclovir (ZOVIRAX) 400 MG tablet For recurrent cold sores, take one tab TID for 5 days. 02/26/16   Monica Bectonhomas J Thekkekandam, MD  cephALEXin (KEFLEX) 500 MG capsule Take 1 capsule (500 mg total) by mouth 2 (two) times daily. For 7 days 05/27/16   Junius FinnerErin O'Malley, PA-C  fluticasone [facility](FLONASE) 50 MCG/ACT nasal spray Place two sprays in each nostril once daily 02/12/14   Lattie HawStephen A Beese, MD  sildenafil (REVATIO) 20 MG tablet One to four tabs by mouth as needed for sex. 02/26/16   Monica Bectonhomas J Thekkekandam, MD   Meds Ordered and Administered this Visit  Medications - No data to display  BP 132/81 (BP Location: Right Arm)   Pulse 73   Temp 98.1 F (36.7 C) (Oral)   Resp 14   Wt 184 lb (83.5 kg)   SpO2 97%   BMI 25.31 kg/m  No data found.   Physical Exam  Constitutional: He is oriented to person, place, and time. He appears well-developed and well-nourished. No distress.  HENT:  Head: Normocephalic and atraumatic.  Eyes: EOM are normal.  Neck: Normal range of motion.  Cardiovascular: Normal rate.   Pulmonary/Chest: Effort normal.  Musculoskeletal: Normal range of motion. He exhibits edema. He exhibits no tenderness.  Left forearm: mild edema to lateral aspect. Non-tender. Full ROM elbow, wrist and fingers. Left ring finger: mild edema, full ROM, non-tender.  Neurological: He is alert and oriented to person, place, and time.  Skin: Skin is warm and dry. Capillary refill takes less than 2 seconds. Rash noted. He is not diaphoretic. There is erythema.  Left forearm: 2-3cm area of erythema, warmth and centralized superficial abrasion c/w insect bite. Mild induration. No fluctuance. Non-tender. Left ring finger: small erythematous pustule to middle phalanx. Scant clear discharge. No red streaking.   Psychiatric: He has a normal mood and affect. His behavior is normal.  Nursing  note and vitals reviewed.   Urgent Care Course   Clinical Course     Procedures (including critical care time)  Labs Review Labs Reviewed - No data to display  Imaging Review No results found.    MDM   1. Cellulitis of left arm    Pt presenting to UC with cellulitis of Left forearm and small pustule on Left ring finger w/o systemic symptoms.   No indication for I&D as no abscess present at this time. Rx: Keflex (only has rash with PCN) discussed signs/symptoms that warrant change of antibiotic.  Encouraged f/u in 4-5 days if not improving, sooner if worsening. Patient verbalized understanding and agreement with treatment plan.    Junius Finnerrin O'Malley, PA-C 05/27/16 1036

## 2016-05-27 NOTE — ED Triage Notes (Signed)
Pt c/o awakening 2 night ago with left 4th finger itching with a possible bite site. Next day LFA swelling with 2 possible bite sites. He feels well otherwise.

## 2016-05-27 NOTE — Discharge Instructions (Signed)
°  Please keep hand and arm clean with soap and water.  You may continue to use over the counter polysporin.    You may use a warm compress such as a warm damp washcloth on forearm to help with swelling.  Also, you may try to keep your hand and arm elevated to help reduce swelling.

## 2016-05-28 DIAGNOSIS — Z880 Allergy status to penicillin: Secondary | ICD-10-CM | POA: Diagnosis not present

## 2016-05-28 DIAGNOSIS — W0110XA Fall on same level from slipping, tripping and stumbling with subsequent striking against unspecified object, initial encounter: Secondary | ICD-10-CM | POA: Diagnosis not present

## 2016-05-28 DIAGNOSIS — S4991XA Unspecified injury of right shoulder and upper arm, initial encounter: Secondary | ICD-10-CM | POA: Diagnosis not present

## 2016-05-28 DIAGNOSIS — Z79899 Other long term (current) drug therapy: Secondary | ICD-10-CM | POA: Diagnosis not present

## 2016-05-28 DIAGNOSIS — M25511 Pain in right shoulder: Secondary | ICD-10-CM | POA: Diagnosis not present

## 2016-06-01 DIAGNOSIS — M25511 Pain in right shoulder: Secondary | ICD-10-CM | POA: Diagnosis not present

## 2016-06-05 DIAGNOSIS — M25511 Pain in right shoulder: Secondary | ICD-10-CM | POA: Diagnosis not present

## 2016-06-08 DIAGNOSIS — M25511 Pain in right shoulder: Secondary | ICD-10-CM | POA: Diagnosis not present

## 2016-10-12 ENCOUNTER — Ambulatory Visit (INDEPENDENT_AMBULATORY_CARE_PROVIDER_SITE_OTHER): Payer: BLUE CROSS/BLUE SHIELD

## 2016-10-12 ENCOUNTER — Encounter: Payer: Self-pay | Admitting: Sports Medicine

## 2016-10-12 ENCOUNTER — Ambulatory Visit (INDEPENDENT_AMBULATORY_CARE_PROVIDER_SITE_OTHER): Payer: BLUE CROSS/BLUE SHIELD | Admitting: Sports Medicine

## 2016-10-12 DIAGNOSIS — M25532 Pain in left wrist: Secondary | ICD-10-CM

## 2016-10-12 NOTE — Progress Notes (Signed)
  Subjective:    CC: Left hand pain  HPI: This is a pleasant 61 year old male, about 8 months ago we did injections into the first extensor compartment, fourth extensor compartment, and radiocarpal joint. This provided fantastic relief, over the past couple weeks he's had a recurrence of pain over the first extensor pulmonary the radiocarpal joint and is requesting interventional treatment today, pain is moderate, persistent without radiation, no mechanical symptoms, no trauma.  Past medical history:  Negative.  See flowsheet/record as well for more information.  Surgical history: Negative.  See flowsheet/record as well for more information.  Family history: Negative.  See flowsheet/record as well for more information.  Social history: Negative.  See flowsheet/record as well for more information.  Allergies, and medications have been entered into the medical record, reviewed, and no changes needed.   Review of Systems: No fevers, chills, night sweats, weight loss, chest pain, or shortness of breath.   Objective:    General: Well Developed, well nourished, and in no acute distress.  Neuro: Alert and oriented x3, extra-ocular muscles intact, sensation grossly intact.  HEENT: Normocephalic, atraumatic, pupils equal round reactive to light, neck supple, no masses, no lymphadenopathy, thyroid nonpalpable.  Skin: Warm and dry, no rashes. Cardiac: Regular rate and rhythm, no murmurs rubs or gallops, no lower extremity edema.  Respiratory: Clear to auscultation bilaterally. Not using accessory muscles, speaking in full sentences.  Procedure: Real-time Ultrasound Guided Injection of left first extensor compartment Device: GE Logiq E  Verbal informed consent obtained.  Time-out conducted.  Noted no overlying erythema, induration, or other signs of local infection.  Skin prepped in a sterile fashion.  Local anesthesia: Topical Ethyl chloride.  With sterile technique and under real time ultrasound  guidance:  Dorsal branch of the radial artery noted just deep to the first extensor compartment, 25-gauge needle advanced into the extensor compartment taking care to stay superficial, 1 mL Kenalog 40, 1 mL lidocaine injected easily. Completed without difficulty  Pain immediately resolved suggesting accurate placement of the medication.  Advised to call if fevers/chills, erythema, induration, drainage, or persistent bleeding.  Images permanently stored and available for review in the ultrasound unit.  Impression: Technically successful ultrasound guided injection.  Procedure: Real-time Ultrasound Guided Injection of left radiocarpal joint Device: GE Logiq E  Verbal informed consent obtained.  Time-out conducted.  Noted no overlying erythema, induration, or other signs of local infection.  Skin prepped in a sterile fashion.  Local anesthesia: Topical Ethyl chloride.  With sterile technique and under real time ultrasound guidance:  25-gauge needle advanced into the radiocarpal joint and 1 mL Kenalog 40, 1 mL lidocaine injected easily. Completed without difficulty  Pain immediately resolved suggesting accurate placement of the medication.  Advised to call if fevers/chills, erythema, induration, drainage, or persistent bleeding.  Images permanently stored and available for review in the ultrasound unit.  Impression: Technically successful ultrasound guided injection.  Impression and Recommendations:    Left wrist pain Pain generators include first extensor compartment and radiocarpal joint. Last injection was 8 months ago. Repeat injections as above. Return in 1 month. We do need baseline x-rays.

## 2016-10-12 NOTE — Assessment & Plan Note (Signed)
Pain generators include first extensor compartment and radiocarpal joint. Last injection was 8 months ago. Repeat injections as above. Return in 1 month. We do need baseline x-rays.

## 2016-11-09 ENCOUNTER — Ambulatory Visit: Payer: BLUE CROSS/BLUE SHIELD | Admitting: Sports Medicine

## 2016-11-16 ENCOUNTER — Ambulatory Visit: Payer: BLUE CROSS/BLUE SHIELD | Admitting: Sports Medicine

## 2017-04-21 ENCOUNTER — Encounter: Payer: Self-pay | Admitting: Sports Medicine

## 2017-04-21 ENCOUNTER — Ambulatory Visit (INDEPENDENT_AMBULATORY_CARE_PROVIDER_SITE_OTHER): Payer: BLUE CROSS/BLUE SHIELD | Admitting: Sports Medicine

## 2017-04-21 DIAGNOSIS — M25532 Pain in left wrist: Secondary | ICD-10-CM | POA: Diagnosis not present

## 2017-04-21 NOTE — Progress Notes (Signed)
Subjective:    CC: Left wrist pain  HPI: This is a pleasant 61 year old male, I saw him 7 months ago, he was having pain referable to the first extensor compartment as well as the radiocarpal joint, the structures were injected with good relief, he is having a similar recurrence of pain and desires injections today, pain is localized over the to above-named structures, no radiation. No mechanical symptoms, no trauma.  Past medical history:  Negative.  See flowsheet/record as well for more information.  Surgical history: Negative.  See flowsheet/record as well for more information.  Family history: Negative.  See flowsheet/record as well for more information.  Social history: Negative.  See flowsheet/record as well for more information.  Allergies, and medications have been entered into the medical record, reviewed, and no changes needed.   Review of Systems: No fevers, chills, night sweats, weight loss, chest pain, or shortness of breath.   Objective:    General: Well Developed, well nourished, and in no acute distress.  Neuro: Alert and oriented x3, extra-ocular muscles intact, sensation grossly intact.  HEENT: Normocephalic, atraumatic, pupils equal round reactive to light, neck supple, no masses, no lymphadenopathy, thyroid nonpalpable.  Skin: Warm and dry, no rashes. Cardiac: Regular rate and rhythm, no murmurs rubs or gallops, no lower extremity edema.  Respiratory: Clear to auscultation bilaterally. Not using accessory muscles, speaking in full sentences. Left Wrist: Inspection normal with no visible erythema or swelling. ROM smooth and normal with good flexion and extension and ulnar/radial deviation that is symmetrical with opposite wrist. Palpation is normal over metacarpals, navicular, lunate, and TFCC; tendons without tenderness/ swelling No snuffbox tenderness. Tenderness over the dorsal radiocarpal joint. No tenderness over Canal of Guyon. Strength 5/5 in all directions  without pain. Negative tinel's and phalens signs. Minimally positive Finkelstein sign with tenderness over the first extensor compartment Negative Watson's test.  Procedure: Real-time Ultrasound Guided Injection of left first extensor compartment Device: GE Logiq E  Verbal informed consent obtained.  Time-out conducted.  Noted no overlying erythema, induration, or other signs of local infection.  Skin prepped in a sterile fashion.  Local anesthesia: Topical Ethyl chloride.  With sterile technique and under real time ultrasound guidance:  Dorsal branch of the radial artery noted just deep to the first extensor compartment, 25-gauge needle advanced into the extensor compartment taking care to stay superficial, 0.5 mL Kenalog 40, 0.5 mL lidocaine injected easily. Completed without difficulty  Pain immediately resolved suggesting accurate placement of the medication.  Advised to call if fevers/chills, erythema, induration, drainage, or persistent bleeding.  Images permanently stored and available for review in the ultrasound unit.  Impression: Technically successful ultrasound guided injection.  Procedure: Real-time Ultrasound Guided Injection of left radiocarpal joint Device: GE Logiq E  Verbal informed consent obtained.  Time-out conducted.  Noted no overlying erythema, induration, or other signs of local infection.  Skin prepped in a sterile fashion.  Local anesthesia: Topical Ethyl chloride.  With sterile technique and under real time ultrasound guidance:  25-gauge needle advanced into the radiocarpal joint and 1 mL Kenalog 40, 1 mL lidocaine injected easily. Completed without difficulty  Pain immediately resolved suggesting accurate placement of the medication.  Advised to call if fevers/chills, erythema, induration, drainage, or persistent bleeding.  Images permanently stored and available for review in the ultrasound unit.  Impression: Technically successful ultrasound guided  injection.  Impression and Recommendations:    Left wrist pain 7 month response to previous injections, repeat left first extensor compartment and  radiocarpal joint injections as above. Wrist was strapped with compressive dressing, return as needed.  ___________________________________________ Ihor Austin. Benjamin Stain, M.D., ABFM., CAQSM. Primary Care and Sports Medicine [place] [facility]  Adjunct Instructor of Family Medicine  [employer]

## 2017-04-21 NOTE — Assessment & Plan Note (Signed)
7 month response to previous injections, repeat left first extensor compartment and radiocarpal joint injections as above. Wrist was strapped with compressive dressing, return as needed.

## 2017-05-21 ENCOUNTER — Ambulatory Visit (INDEPENDENT_AMBULATORY_CARE_PROVIDER_SITE_OTHER): Payer: BLUE CROSS/BLUE SHIELD | Admitting: Sports Medicine

## 2017-05-21 ENCOUNTER — Ambulatory Visit (INDEPENDENT_AMBULATORY_CARE_PROVIDER_SITE_OTHER): Payer: BLUE CROSS/BLUE SHIELD

## 2017-05-21 DIAGNOSIS — M11261 Other chondrocalcinosis, right knee: Secondary | ICD-10-CM

## 2017-05-21 DIAGNOSIS — M1711 Unilateral primary osteoarthritis, right knee: Secondary | ICD-10-CM

## 2017-05-21 DIAGNOSIS — M7989 Other specified soft tissue disorders: Secondary | ICD-10-CM | POA: Diagnosis not present

## 2017-05-21 DIAGNOSIS — M11262 Other chondrocalcinosis, left knee: Secondary | ICD-10-CM

## 2017-05-21 DIAGNOSIS — M25562 Pain in left knee: Secondary | ICD-10-CM | POA: Diagnosis not present

## 2017-05-21 MED ORDER — IBUPROFEN 800 MG PO TABS: 800.0000 mg | ORAL_TABLET | Freq: Three times a day (TID) | ORAL | 2 refills | Status: DC | PRN

## 2017-05-21 NOTE — Progress Notes (Signed)
  Subjective:    CC: Right knee pain  HPI: For the past several weeks this pleasant 61 year old male has noted increasing pain, without swelling in his right knee, medial joint line, no mechanical symptoms, moderate, persistent without radiation.  He does have a history of right prepatellar bursitis, this has been resolved after an aspiration and injection a year ago.  Past medical history:  Negative.  See flowsheet/record as well for more information.  Surgical history: Negative.  See flowsheet/record as well for more information.  Family history: Negative.  See flowsheet/record as well for more information.  Social history: Negative.  See flowsheet/record as well for more information.  Allergies, and medications have been entered into the medical record, reviewed, and no changes needed.   Review of Systems: No fevers, chills, night sweats, weight loss, chest pain, or shortness of breath.   Objective:    General: Well Developed, well nourished, and in no acute distress.  Neuro: Alert and oriented x3, extra-ocular muscles intact, sensation grossly intact.  HEENT: Normocephalic, atraumatic, pupils equal round reactive to light, neck supple, no masses, no lymphadenopathy, thyroid nonpalpable.  Skin: Warm and dry, no rashes. Cardiac: Regular rate and rhythm, no murmurs rubs or gallops, no lower extremity edema.  Respiratory: Clear to auscultation bilaterally. Not using accessory muscles, speaking in full sentences. Right knee: Normal to inspection with no erythema or effusion or obvious bony abnormalities. Tender to palpation of the medial joint line ROM normal in flexion and extension and lower leg rotation, reproduction of pain with terminal flexion. Ligaments with solid consistent endpoints including ACL, PCL, LCL, MCL. Negative Mcmurray's and provocative meniscal tests. Non painful patellar compression. Patellar and quadriceps tendons unremarkable. Hamstring and quadriceps strength is  normal.  Procedure: Real-time Ultrasound Guided Injection of right knee Device: GE Logiq E  Verbal informed consent obtained.  Time-out conducted.  Noted no overlying erythema, induration, or other signs of local infection.  Skin prepped in a sterile fashion.  Local anesthesia: Topical Ethyl chloride.  With sterile technique and under real time ultrasound guidance: 1 cc kenalog 40, 2 cc lidocaine, 2 cc bupivacaine injected easily Completed without difficulty  Pain immediately resolved suggesting accurate placement of the medication.  Advised to call if fevers/chills, erythema, induration, drainage, or persistent bleeding.  Images permanently stored and available for review in the ultrasound unit.  Impression: Technically successful ultrasound guided injection.  Impression and Recommendations:    Primary osteoarthritis of right knee Did not respond to over-the-counter ibuprofen, adding prescription strength ibuprofen, injection. X-rays, rehab exercises, return in 1 month.  ___________________________________________ Ihor Austinhomas J. Benjamin Stainhekkekandam, M.D., ABFM., CAQSM. Primary Care and Sports Medicine [place] [facility]  Adjunct Instructor of Family Medicine  [employer]

## 2017-05-21 NOTE — Assessment & Plan Note (Signed)
Did not respond to over-the-counter ibuprofen, adding prescription strength ibuprofen, injection. X-rays, rehab exercises, return in 1 month.

## 2017-06-18 ENCOUNTER — Encounter: Payer: Self-pay | Admitting: Sports Medicine

## 2017-06-18 ENCOUNTER — Ambulatory Visit: Payer: BLUE CROSS/BLUE SHIELD | Admitting: Sports Medicine

## 2017-06-18 DIAGNOSIS — M1711 Unilateral primary osteoarthritis, right knee: Secondary | ICD-10-CM

## 2017-06-18 NOTE — Progress Notes (Signed)
  Subjective:    CC: Knee pain  HPI: Cameron Washington returns, I injected his right knee at the last visit, he is doing extremely well.  Past medical history:  Negative.  See flowsheet/record as well for more information.  Surgical history: Negative.  See flowsheet/record as well for more information.  Family history: Negative.  See flowsheet/record as well for more information.  Social history: Negative.  See flowsheet/record as well for more information.  Allergies, and medications have been entered into the medical record, reviewed, and no changes needed.   Review of Systems: No fevers, chills, night sweats, weight loss, chest pain, or shortness of breath.   Objective:    General: Well Developed, well nourished, and in no acute distress.  Neuro: Alert and oriented x3, extra-ocular muscles intact, sensation grossly intact.  HEENT: Normocephalic, atraumatic, pupils equal round reactive to light, neck supple, no masses, no lymphadenopathy, thyroid nonpalpable.  Skin: Warm and dry, no rashes. Cardiac: Regular rate and rhythm, no murmurs rubs or gallops, no lower extremity edema.  Respiratory: Clear to auscultation bilaterally. Not using accessory muscles, speaking in full sentences. Right knee: Normal to inspection with no erythema or effusion or obvious bony abnormalities. Palpation normal with no warmth or joint line tenderness or patellar tenderness or condyle tenderness. ROM normal in flexion and extension and lower leg rotation. Ligaments with solid consistent endpoints including ACL, PCL, LCL, MCL. Negative Mcmurray's and provocative meniscal tests. Non painful patellar compression. Patellar and quadriceps tendons unremarkable. Hamstring and quadriceps strength is normal.  Impression and Recommendations:    Primary osteoarthritis of right knee Knee is doing extremely well after injection last month. If recurrence of pain within 2 months we will proceed with Orthovisc. Otherwise return  as needed. ___________________________________________ Cameron Washington, M.D., ABFM., CAQSM. Primary Care and Sports Medicine [place] [facility]  Adjunct Instructor of Family Medicine  [employer]

## 2017-06-18 NOTE — Assessment & Plan Note (Signed)
Knee is doing extremely well after injection last month. If recurrence of pain within 2 months we will proceed with Orthovisc. Otherwise return as needed.

## 2017-09-13 DIAGNOSIS — J101 Influenza due to other identified influenza virus with other respiratory manifestations: Secondary | ICD-10-CM | POA: Diagnosis not present

## 2017-09-13 DIAGNOSIS — R509 Fever, unspecified: Secondary | ICD-10-CM | POA: Diagnosis not present

## 2018-01-12 DIAGNOSIS — M205X1 Other deformities of toe(s) (acquired), right foot: Secondary | ICD-10-CM | POA: Diagnosis not present

## 2018-01-12 DIAGNOSIS — M722 Plantar fascial fibromatosis: Secondary | ICD-10-CM | POA: Diagnosis not present

## 2018-01-12 DIAGNOSIS — M79671 Pain in right foot: Secondary | ICD-10-CM | POA: Diagnosis not present

## 2018-01-25 DIAGNOSIS — R2689 Other abnormalities of gait and mobility: Secondary | ICD-10-CM | POA: Diagnosis not present

## 2018-01-25 DIAGNOSIS — M722 Plantar fascial fibromatosis: Secondary | ICD-10-CM | POA: Diagnosis not present

## 2018-01-25 DIAGNOSIS — M79671 Pain in right foot: Secondary | ICD-10-CM | POA: Diagnosis not present

## 2018-02-15 DIAGNOSIS — M722 Plantar fascial fibromatosis: Secondary | ICD-10-CM | POA: Diagnosis not present

## 2018-02-15 DIAGNOSIS — M79671 Pain in right foot: Secondary | ICD-10-CM | POA: Diagnosis not present

## 2018-08-26 DIAGNOSIS — Z125 Encounter for screening for malignant neoplasm of prostate: Secondary | ICD-10-CM | POA: Diagnosis not present

## 2018-08-26 DIAGNOSIS — G4709 Other insomnia: Secondary | ICD-10-CM | POA: Diagnosis not present

## 2018-08-26 DIAGNOSIS — Z13220 Encounter for screening for lipoid disorders: Secondary | ICD-10-CM | POA: Diagnosis not present

## 2018-08-26 DIAGNOSIS — B009 Herpesviral infection, unspecified: Secondary | ICD-10-CM | POA: Diagnosis not present

## 2018-08-26 DIAGNOSIS — Z1211 Encounter for screening for malignant neoplasm of colon: Secondary | ICD-10-CM | POA: Diagnosis not present

## 2018-12-27 IMAGING — DX DG KNEE 1-2V*L*
4 series · 4 of 4 positions shown · non-contrast
Comparison: None.

CLINICAL DATA: Right-sided pain. Two AP images through the
bilateral knees. No sunrise on lateral images on the left.

EXAM:
LEFT KNEE - 1-2 VIEW

[tunnel]
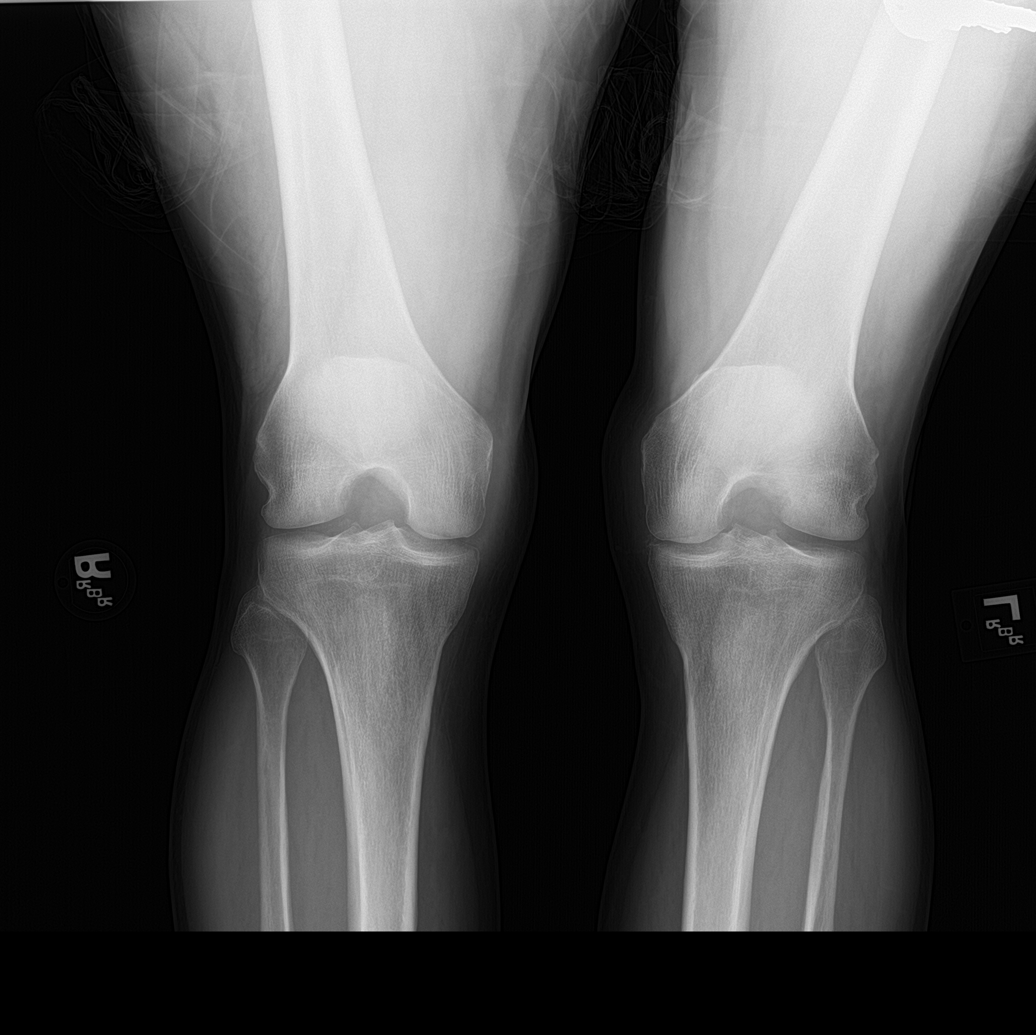

[knee lat]
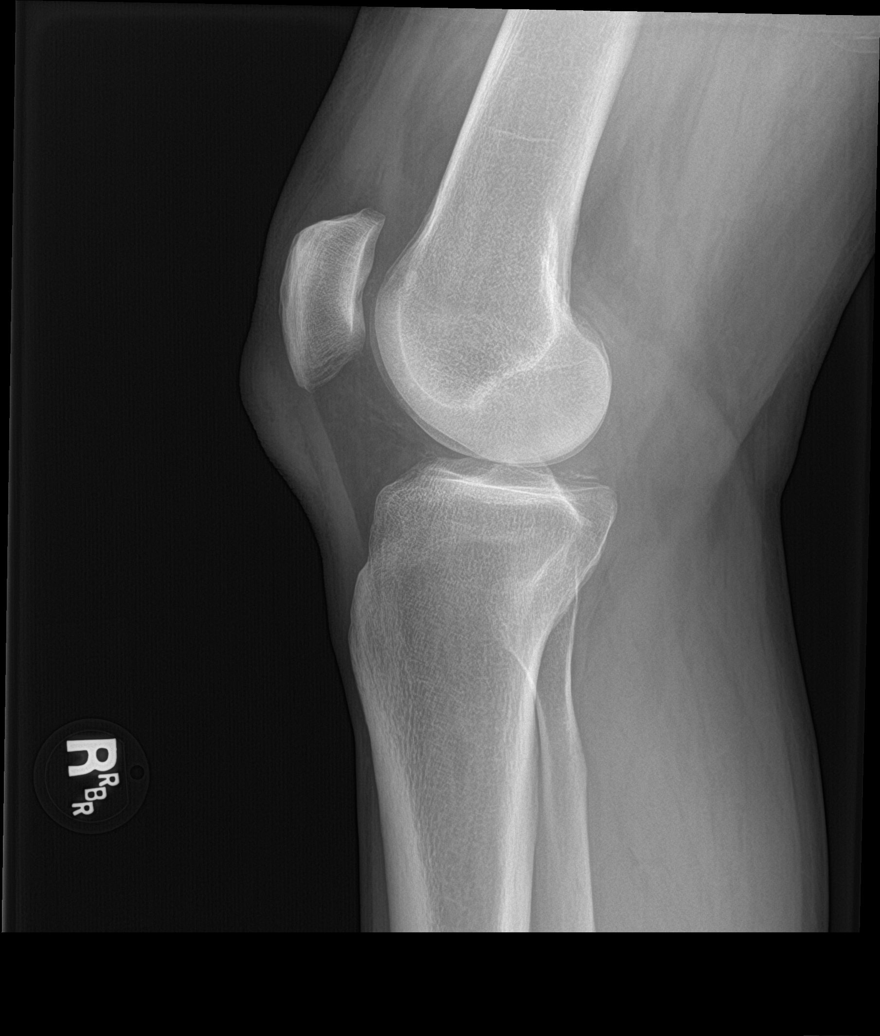

[knee sunrise]
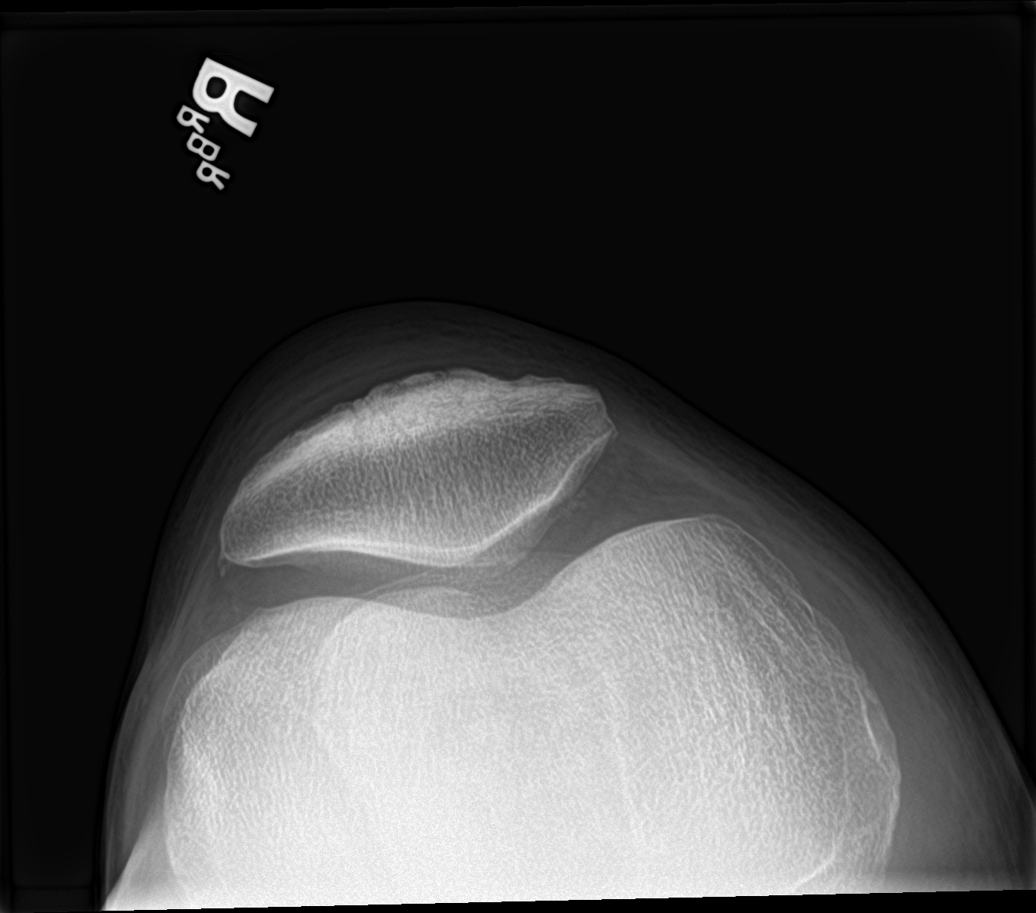

[knee ap bilat standing]
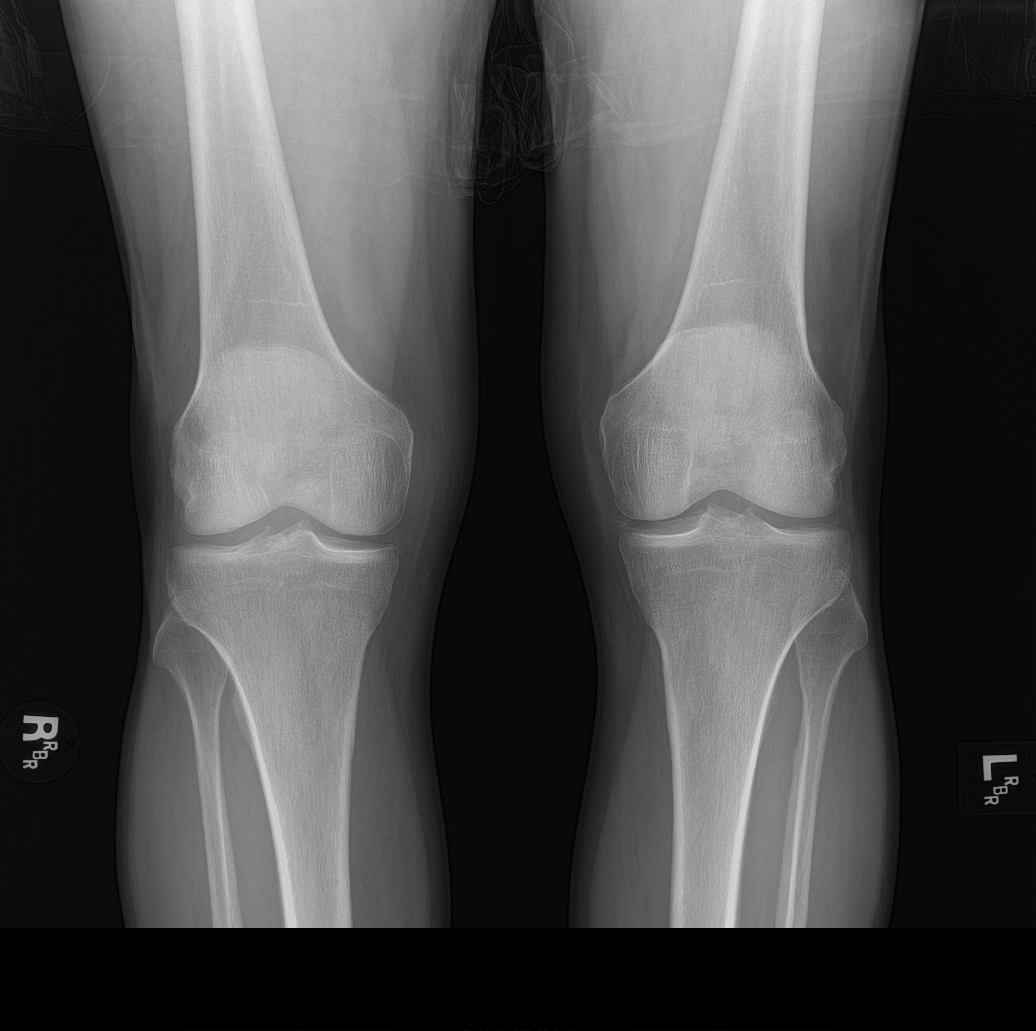

[4 of 4 positions shown; findings below may reference images not displayed]

FINDINGS: Chondrocalcinosis bilaterally. No significant degenerative changes.
No fractures identified on the left.
IMPRESSION: Chondrocalcinosis consistent with CPPD.

## 2019-03-07 DIAGNOSIS — Z1211 Encounter for screening for malignant neoplasm of colon: Secondary | ICD-10-CM | POA: Diagnosis not present

## 2019-03-07 DIAGNOSIS — K648 Other hemorrhoids: Secondary | ICD-10-CM | POA: Diagnosis not present

## 2020-05-17 ENCOUNTER — Ambulatory Visit (INDEPENDENT_AMBULATORY_CARE_PROVIDER_SITE_OTHER): Payer: BLUE CROSS/BLUE SHIELD | Admitting: Sports Medicine

## 2020-05-17 DIAGNOSIS — M65311 Trigger thumb, right thumb: Secondary | ICD-10-CM | POA: Insufficient documentation

## 2020-05-17 MED ORDER — MELOXICAM 15 MG PO TABS: ORAL_TABLET | ORAL | 3 refills | Status: AC

## 2020-05-17 NOTE — Assessment & Plan Note (Signed)
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 Worse at night and in the morning. Clinically he has a palpable nodule at the flexor pollicis longus tendon, we will treat this conservatively, meloxicam, trigger thumb rehab exercises, return to see me in 2 to 4 weeks, FPL injection if no better.

## 2020-05-17 NOTE — Progress Notes (Signed)
    Procedures performed today:    None.  Independent interpretation of notes and tests performed by another provider:   None.  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 Worse at night and in the morning. Clinically he has a palpable nodule at the flexor pollicis longus tendon, we will treat this conservatively, meloxicam, trigger thumb rehab exercises, return to see me in 2 to 4 weeks, FPL injection if no better.    ___________________________________________ Cameron Washington. Benjamin Stain, M.D., ABFM., CAQSM. Primary Care and Sports Medicine [place] [facility]  Adjunct Instructor of Family Medicine  [employer]

## 2020-06-17 ENCOUNTER — Ambulatory Visit: Payer: BLUE CROSS/BLUE SHIELD | Admitting: Sports Medicine

## 2020-08-30 ENCOUNTER — Ambulatory Visit: Payer: BLUE CROSS/BLUE SHIELD | Admitting: Sports Medicine

## 2020-08-30 ENCOUNTER — Other Ambulatory Visit: Payer: Self-pay

## 2020-08-30 ENCOUNTER — Ambulatory Visit (INDEPENDENT_AMBULATORY_CARE_PROVIDER_SITE_OTHER): Payer: BLUE CROSS/BLUE SHIELD

## 2020-08-30 DIAGNOSIS — M65311 Trigger thumb, right thumb: Secondary | ICD-10-CM

## 2020-08-30 NOTE — Assessment & Plan Note (Signed)
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 He has never been injected so we did an FPL injection today, return to see me in 1 month.

## 2020-08-30 NOTE — Progress Notes (Signed)
    Procedures performed today:    Procedure: Real-time Ultrasound Guided injection of the right flexor pollicis longus tendon sheath Device: Samsung HS60  Verbal informed consent obtained.  Time-out conducted.  Noted no overlying erythema, induration, or other signs of local infection.  Skin prepped in a sterile fashion.  Local anesthesia: Topical Ethyl chloride.  With sterile technique and under real time ultrasound guidance:  Noted tendon sheath effusion, 1/2 cc lidocaine, 1/2 cc kenalog 40 injected easily.   Completed without difficulty  Advised to call if fevers/chills, erythema, induration, drainage, or persistent bleeding.  Images permanently stored and available for review in PACS.  Impression: Technically successful ultrasound guided injection.  Independent interpretation of notes and tests performed by another provider:   None.  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 He has never been injected so we did an FPL injection today, return to see me in 1 month.    ___________________________________________ Ihor Austin. Benjamin Stain, M.D., ABFM., CAQSM. Primary Care and Sports Medicine [place] [facility]  Adjunct Instructor of Family Medicine  [employer]

## 2020-09-30 ENCOUNTER — Other Ambulatory Visit: Payer: Self-pay

## 2020-09-30 ENCOUNTER — Ambulatory Visit (INDEPENDENT_AMBULATORY_CARE_PROVIDER_SITE_OTHER): Payer: Medicare HMO | Admitting: Sports Medicine

## 2020-09-30 DIAGNOSIS — M65311 Trigger thumb, right thumb: Secondary | ICD-10-CM | POA: Diagnosis not present

## 2020-09-30 NOTE — Assessment & Plan Note (Signed)
Trigger thumb injection at the last visit provided 100% relief, return as needed. Continue trigger thumb rehab exercises.

## 2020-09-30 NOTE — Progress Notes (Signed)
    Procedures performed today:    None.  Independent interpretation of notes and tests performed by another provider:   None.  Brief History, Exam, Impression, and Recommendations:    Trigger thumb, right thumb Trigger thumb injection at the last visit provided 100% relief, return as needed. Continue trigger thumb rehab exercises.    ___________________________________________ Ihor Austin. Benjamin Stain, M.D., ABFM., CAQSM. Primary Care and Sports Medicine [facility]  Adjunct Instructor of Family Medicine  [employer]

## 2022-07-28 ENCOUNTER — Ambulatory Visit (INDEPENDENT_AMBULATORY_CARE_PROVIDER_SITE_OTHER): Payer: Medicare HMO

## 2022-07-28 ENCOUNTER — Ambulatory Visit (INDEPENDENT_AMBULATORY_CARE_PROVIDER_SITE_OTHER): Payer: Medicare HMO | Admitting: Sports Medicine

## 2022-07-28 DIAGNOSIS — M1711 Unilateral primary osteoarthritis, right knee: Secondary | ICD-10-CM

## 2022-07-28 MED ORDER — TRIAMCINOLONE ACETONIDE 40 MG/ML IJ SUSP
40.0000 mg | Freq: Once | INTRAMUSCULAR | Status: AC
  Administered 2022-07-28: 40 mg via INTRAMUSCULAR

## 2022-07-28 NOTE — Progress Notes (Signed)
    Procedures performed today:    Procedure: Real-time Ultrasound Guided injection of the right knee Device: Samsung HS60  Verbal informed consent obtained.  Time-out conducted.  Noted no overlying erythema, induration, or other signs of local infection.  Skin prepped in a sterile fashion.  Local anesthesia: Topical Ethyl chloride.  With sterile technique and under real time ultrasound guidance: No effusion noted, 1 cc Kenalog 40, 2 cc lidocaine, 2 cc bupivacaine injected easily Completed without difficulty  Advised to call if fevers/chills, erythema, induration, drainage, or persistent bleeding.  Images permanently stored and available for review in PACS.  Impression: Technically successful ultrasound guided injection.  Independent interpretation of notes and tests performed by another provider:   None.  Brief History, Exam, Impression, and Recommendations:    Primary osteoarthritis of right knee Atsushi returns, he is a very pleasant 67 year old male, he has knee osteoarthritis, last injected in October 2018 now with recurrence of pain but mild, desires injection, injection performed today, return to see me as needed.  Chronic process with exacerbation and pharmacologic intervention  ____________________________________________ Gwen Her. Dianah Field, M.D., ABFM., CAQSM., AME. Primary Care and Sports Medicine [facility]  Adjunct Professor of [employer] [facility] [employer]  Risk manager

## 2022-07-28 NOTE — Assessment & Plan Note (Signed)
Cameron Washington returns, he is a very pleasant 67 year old male, he has knee osteoarthritis, last injected in October 2018 now with recurrence of pain but mild, desires injection, injection performed today, return to see me as needed.

## 2022-07-28 NOTE — Addendum Note (Signed)
Addended by: Tarri Glenn A on: 07/28/2022 08:44 AM   Modules accepted: Orders

## 2023-04-28 ENCOUNTER — Ambulatory Visit (INDEPENDENT_AMBULATORY_CARE_PROVIDER_SITE_OTHER): Payer: Medicare HMO | Admitting: Sports Medicine

## 2023-04-28 ENCOUNTER — Encounter: Payer: Self-pay | Admitting: Sports Medicine

## 2023-04-28 ENCOUNTER — Other Ambulatory Visit (INDEPENDENT_AMBULATORY_CARE_PROVIDER_SITE_OTHER): Payer: Medicare HMO

## 2023-04-28 DIAGNOSIS — M1711 Unilateral primary osteoarthritis, right knee: Secondary | ICD-10-CM | POA: Diagnosis not present

## 2023-04-28 MED ORDER — TRIAMCINOLONE ACETONIDE 40 MG/ML IJ SUSP
40.0000 mg | Freq: Once | INTRAMUSCULAR | Status: AC
  Administered 2023-04-28: 40 mg via INTRAMUSCULAR

## 2023-04-28 NOTE — Progress Notes (Signed)
    Procedures performed today:    Procedure: Real-time Ultrasound Guided injection of the right knee Device: Samsung HS60  Verbal informed consent obtained.  Time-out conducted.  Noted no overlying erythema, induration, or other signs of local infection.  Skin prepped in a sterile fashion.  Local anesthesia: Topical Ethyl chloride.  With sterile technique and under real time ultrasound guidance: No effusion noted, 1 cc Kenalog 40, 2 cc lidocaine, 2 cc bupivacaine injected easily Completed without difficulty  Advised to call if fevers/chills, erythema, induration, drainage, or persistent bleeding.  Images permanently stored and available for review in PACS.  Impression: Technically successful ultrasound guided injection.  Independent interpretation of notes and tests performed by another provider:   None.  Brief History, Exam, Impression, and Recommendations:    Primary osteoarthritis of right knee Hamdan returns, he has right knee osteoarthritis last injected January 2024 now with recurrence of pain, repeat injection today, return as needed.    ____________________________________________ Cameron Washington. Benjamin Stain, M.D., ABFM., CAQSM., AME. Primary Care and Sports Medicine [facility]  Adjunct Professor of Family Medicine  [employer] [facility] [employer]  Restaurant manager, fast food

## 2023-04-28 NOTE — Assessment & Plan Note (Signed)
[place] returns, he has right knee osteoarthritis last injected January 2024 now with recurrence of pain, repeat injection today, return as needed.

## 2023-04-28 NOTE — Addendum Note (Signed)
Addended by: Carren Rang A on: 04/28/2023 01:33 PM   Modules accepted: Orders

## 2023-10-28 ENCOUNTER — Ambulatory Visit: Admitting: Sports Medicine

## 2024-02-24 ENCOUNTER — Other Ambulatory Visit (INDEPENDENT_AMBULATORY_CARE_PROVIDER_SITE_OTHER)

## 2024-02-24 ENCOUNTER — Ambulatory Visit (INDEPENDENT_AMBULATORY_CARE_PROVIDER_SITE_OTHER): Admitting: Sports Medicine

## 2024-02-24 ENCOUNTER — Encounter: Payer: Self-pay | Admitting: Sports Medicine

## 2024-02-24 DIAGNOSIS — M722 Plantar fascial fibromatosis: Secondary | ICD-10-CM

## 2024-02-24 MED ORDER — TRIAMCINOLONE ACETONIDE 40 MG/ML IJ SUSP
40.0000 mg | Freq: Once | INTRAMUSCULAR | Status: AC
  Administered 2024-02-24: 40 mg via INTRA_ARTICULAR

## 2024-02-24 NOTE — Progress Notes (Signed)
    Procedures performed today:    Procedure: Real-time Ultrasound Guided injection of the left plantar fascial origin Device: Samsung HS60  Verbal informed consent obtained.  Time-out conducted.  Noted no overlying erythema, induration, or other signs of local infection.  Skin prepped in a sterile fashion.  Local anesthesia: Topical Ethyl chloride.  With sterile technique and under real time ultrasound guidance: Noted thickened plantar fascia origin, 1 cc Kenalog  40, 1 cc lidocaine, 1 cc bupivacaine injected easily Completed without difficulty  Advised to call if fevers/chills, erythema, induration, drainage, or persistent bleeding.  Images permanently stored and available for review in PACS.  Impression: Technically successful ultrasound guided injection.  Independent interpretation of notes and tests performed by another provider:   None.  Brief History, Exam, Impression, and Recommendations:    Plantar fasciitis, left Very the, he has had 6 weeks of pain left foot plantar aspect of the heel worst in the morning. He had this sometime ago and it lasted 6 months. He is interested in an injection today. On exam he has tenderness to palpation directly on the plantar aspect of the calcaneal insertion plantar fascia, pes cavus, no tenderness at the Achilles insertion. Today we did a plantar fascial injection, he will avoid barefoot walking, home PT given, return to see me 6 weeks as needed.    ____________________________________________ Debby PARAS. Curtis, M.D., ABFM., CAQSM., AME. Primary Care and Sports Medicine  [facility]  Adjunct Professor of [facility] [employer]  [employer]  Restaurant manager, fast food

## 2024-02-24 NOTE — Addendum Note (Signed)
 Addended by: OLIVA-AVELLANEDA, Jaydeen Darley L on: 02/24/2024 10:37 AM   Modules accepted: Orders

## 2024-02-24 NOTE — Assessment & Plan Note (Signed)
 Very the, he has had 6 weeks of pain left foot plantar aspect of the heel worst in the morning. He had this sometime ago and it lasted 6 months. He is interested in an injection today. On exam he has tenderness to palpation directly on the plantar aspect of the calcaneal insertion plantar fascia, pes cavus, no tenderness at the Achilles insertion. Today we did a plantar fascial injection, he will avoid barefoot walking, home PT given, return to see me 6 weeks as needed.

## 2024-03-23 ENCOUNTER — Encounter: Payer: Self-pay | Admitting: Sports Medicine

## 2024-04-05 ENCOUNTER — Ambulatory Visit: Admitting: Sports Medicine
